# Patient Record
Sex: Male | Born: 1935 | Race: White | Hispanic: No | Marital: Married | Smoking: Former smoker
Health system: Southern US, Community
[De-identification: ages and names within clinical notes are randomized; demographics above are authoritative.]

## PROBLEM LIST (undated history)

## (undated) DIAGNOSIS — C61 Malignant neoplasm of prostate: Secondary | ICD-10-CM

## (undated) DIAGNOSIS — C649 Malignant neoplasm of unspecified kidney, except renal pelvis: Principal | ICD-10-CM

## (undated) DIAGNOSIS — N189 Chronic kidney disease, unspecified: Secondary | ICD-10-CM

## (undated) DIAGNOSIS — Z8719 Personal history of other diseases of the digestive system: Secondary | ICD-10-CM

## (undated) DIAGNOSIS — M1990 Unspecified osteoarthritis, unspecified site: Secondary | ICD-10-CM

## (undated) DIAGNOSIS — E785 Hyperlipidemia, unspecified: Secondary | ICD-10-CM

## (undated) DIAGNOSIS — C4491 Basal cell carcinoma of skin, unspecified: Secondary | ICD-10-CM

## (undated) DIAGNOSIS — M48061 Spinal stenosis, lumbar region without neurogenic claudication: Secondary | ICD-10-CM

## (undated) DIAGNOSIS — I10 Essential (primary) hypertension: Secondary | ICD-10-CM

## (undated) DIAGNOSIS — G4733 Obstructive sleep apnea (adult) (pediatric): Secondary | ICD-10-CM

## (undated) DIAGNOSIS — J129 Viral pneumonia, unspecified: Secondary | ICD-10-CM

## (undated) HISTORY — PX: BASAL CELL CARCINOMA EXCISION: SHX1214

## (undated) HISTORY — PX: INGUINAL HERNIA REPAIR: SHX194

## (undated) HISTORY — PX: LUMBAR LAMINECTOMY: SHX95

## (undated) HISTORY — DX: Malignant neoplasm of unspecified kidney, except renal pelvis: C64.9

## (undated) HISTORY — PX: INGUINAL HERNIA REPAIR: SUR1180

## (undated) HISTORY — PX: FRACTURE SURGERY: SHX138

## (undated) HISTORY — DX: Hyperlipidemia, unspecified: E78.5

---
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
## 1969-06-16 HISTORY — PX: VASECTOMY: SHX75

## 1999-04-15 ENCOUNTER — Ambulatory Visit: Admission: RE | Admit: 1999-04-15 | Discharge: 1999-04-15 | Payer: Self-pay | Admitting: Family Medicine

## 2002-12-31 ENCOUNTER — Encounter (INDEPENDENT_AMBULATORY_CARE_PROVIDER_SITE_OTHER): Payer: Self-pay | Admitting: Specialist

## 2002-12-31 ENCOUNTER — Ambulatory Visit (HOSPITAL_COMMUNITY): Admission: RE | Admit: 2002-12-31 | Discharge: 2002-12-31 | Payer: Self-pay | Admitting: Gastroenterology

## 2003-10-17 HISTORY — PX: FIBULA FRACTURE SURGERY: SHX947

## 2006-12-18 ENCOUNTER — Ambulatory Visit: Payer: Self-pay | Admitting: Family Medicine

## 2006-12-18 DIAGNOSIS — R5383 Other fatigue: Secondary | ICD-10-CM

## 2006-12-18 DIAGNOSIS — I10 Essential (primary) hypertension: Secondary | ICD-10-CM | POA: Insufficient documentation

## 2006-12-18 DIAGNOSIS — R5381 Other malaise: Secondary | ICD-10-CM

## 2006-12-22 ENCOUNTER — Encounter: Payer: Self-pay | Admitting: Family Medicine

## 2006-12-24 ENCOUNTER — Encounter: Payer: Self-pay | Admitting: Family Medicine

## 2006-12-24 LAB — CONVERTED CEMR LAB
ALT: 16 units/L (ref 0–53)
AST: 14 units/L (ref 0–37)
Alkaline Phosphatase: 49 units/L (ref 39–117)
BUN: 17 mg/dL (ref 6–23)
Calcium: 9.9 mg/dL (ref 8.4–10.5)
Creatinine, Ser: 0.86 mg/dL (ref 0.40–1.50)
HCT: 48.7 % (ref 39.0–52.0)
HDL: 51 mg/dL (ref >39)
Hemoglobin: 15.7 g/dL (ref 13.0–17.0)
MCHC: 32.2 g/dL (ref 30.0–36.0)
MCV: 93.5 fL (ref 78.0–100.0)
Platelets: 270 10*3/uL (ref 150–400)
RDW: 13.3 % (ref 11.5–14.0)
TSH: 2.556 microintl units/mL (ref 0.350–5.50)
Total Bilirubin: 0.4 mg/dL (ref 0.3–1.2)
Total CHOL/HDL Ratio: 4.1
VLDL: 16 mg/dL (ref 0–40)

## 2007-01-09 ENCOUNTER — Ambulatory Visit: Payer: Self-pay | Admitting: Family Medicine

## 2007-01-09 DIAGNOSIS — M25569 Pain in unspecified knee: Secondary | ICD-10-CM | POA: Insufficient documentation

## 2007-01-09 DIAGNOSIS — E7800 Pure hypercholesterolemia, unspecified: Secondary | ICD-10-CM | POA: Insufficient documentation

## 2007-04-30 ENCOUNTER — Ambulatory Visit: Payer: Self-pay | Admitting: Family Medicine

## 2007-04-30 DIAGNOSIS — H9319 Tinnitus, unspecified ear: Secondary | ICD-10-CM | POA: Insufficient documentation

## 2007-05-03 ENCOUNTER — Encounter: Payer: Self-pay | Admitting: Family Medicine

## 2007-05-03 LAB — CONVERTED CEMR LAB: Direct LDL: 101 mg/dL — ABNORMAL HIGH

## 2007-10-17 DIAGNOSIS — C61 Malignant neoplasm of prostate: Secondary | ICD-10-CM

## 2007-10-17 HISTORY — DX: Malignant neoplasm of prostate: C61

## 2007-12-31 ENCOUNTER — Ambulatory Visit: Payer: Self-pay | Admitting: Family Medicine

## 2007-12-31 DIAGNOSIS — R351 Nocturia: Secondary | ICD-10-CM | POA: Insufficient documentation

## 2007-12-31 DIAGNOSIS — F528 Other sexual dysfunction not due to a substance or known physiological condition: Secondary | ICD-10-CM | POA: Insufficient documentation

## 2008-01-01 ENCOUNTER — Ambulatory Visit: Payer: Self-pay | Admitting: Family Medicine

## 2008-01-01 DIAGNOSIS — C61 Malignant neoplasm of prostate: Secondary | ICD-10-CM

## 2008-01-01 LAB — CONVERTED CEMR LAB
ALT: 22 units/L (ref 0–53)
AST: 16 units/L (ref 0–37)
BUN: 11 mg/dL (ref 6–23)
Bilirubin Urine: NEGATIVE
Blood in Urine, dipstick: NEGATIVE
Calcium: 10.2 mg/dL (ref 8.4–10.5)
Chloride: 100 meq/L (ref 96–112)
Cholesterol: 167 mg/dL (ref 0–200)
Creatinine, Ser: 0.81 mg/dL (ref 0.40–1.50)
Glucose, Urine, Semiquant: NEGATIVE
HDL: 52 mg/dL (ref >39)
Specific Gravity, Urine: 1.015
TSH: 2.93 microintl units/mL (ref 0.350–5.50)
Total Bilirubin: 0.7 mg/dL (ref 0.3–1.2)
Total CHOL/HDL Ratio: 3.2
VLDL: 29 mg/dL (ref 0–40)
pH: 7.5

## 2008-01-13 ENCOUNTER — Encounter: Payer: Self-pay | Admitting: Family Medicine

## 2008-01-30 ENCOUNTER — Encounter: Payer: Self-pay | Admitting: Family Medicine

## 2008-02-05 ENCOUNTER — Encounter: Payer: Self-pay | Admitting: Family Medicine

## 2008-03-16 ENCOUNTER — Encounter: Payer: Self-pay | Admitting: Family Medicine

## 2008-03-17 ENCOUNTER — Ambulatory Visit: Admission: RE | Admit: 2008-03-17 | Discharge: 2008-06-15 | Payer: Self-pay | Admitting: Radiation Oncology

## 2008-03-23 ENCOUNTER — Encounter: Payer: Self-pay | Admitting: Family Medicine

## 2008-06-03 ENCOUNTER — Encounter: Payer: Self-pay | Admitting: Family Medicine

## 2008-07-13 ENCOUNTER — Ambulatory Visit: Admission: RE | Admit: 2008-07-13 | Discharge: 2008-10-05 | Payer: Self-pay | Admitting: Radiation Oncology

## 2008-08-18 ENCOUNTER — Ambulatory Visit: Payer: Self-pay | Admitting: Family Medicine

## 2008-08-18 DIAGNOSIS — L988 Other specified disorders of the skin and subcutaneous tissue: Secondary | ICD-10-CM

## 2008-08-19 LAB — CONVERTED CEMR LAB
BUN: 12 mg/dL (ref 6–23)
Chloride: 102 meq/L (ref 96–112)
Creatinine, Ser: 0.79 mg/dL (ref 0.40–1.50)
Glucose, Bld: 88 mg/dL (ref 70–99)
Potassium: 4.5 meq/L (ref 3.5–5.3)

## 2008-09-28 ENCOUNTER — Encounter: Payer: Self-pay | Admitting: Family Medicine

## 2008-10-29 ENCOUNTER — Encounter: Payer: Self-pay | Admitting: Family Medicine

## 2009-04-28 ENCOUNTER — Encounter: Payer: Self-pay | Admitting: Family Medicine

## 2009-07-05 ENCOUNTER — Ambulatory Visit: Payer: Self-pay | Admitting: Family Medicine

## 2009-07-05 ENCOUNTER — Encounter: Admission: RE | Admit: 2009-07-05 | Discharge: 2009-07-05 | Payer: Self-pay | Admitting: General Surgery

## 2009-07-06 LAB — CONVERTED CEMR LAB
Albumin: 4.7 g/dL (ref 3.5–5.2)
Alkaline Phosphatase: 44 units/L (ref 39–117)
BUN: 10 mg/dL (ref 6–23)
Creatinine, Ser: 0.69 mg/dL (ref 0.40–1.50)
Glucose, Bld: 94 mg/dL (ref 70–99)
HDL: 62 mg/dL (ref >39)
LDL Cholesterol: 85 mg/dL (ref 0–99)
Total Bilirubin: 0.5 mg/dL (ref 0.3–1.2)
Total CHOL/HDL Ratio: 2.7
Triglycerides: 100 mg/dL (ref <150)
VLDL: 20 mg/dL (ref 0–40)

## 2009-07-08 ENCOUNTER — Encounter: Payer: Self-pay | Admitting: Family Medicine

## 2009-07-09 ENCOUNTER — Ambulatory Visit (HOSPITAL_BASED_OUTPATIENT_CLINIC_OR_DEPARTMENT_OTHER): Admission: RE | Admit: 2009-07-09 | Discharge: 2009-07-09 | Payer: Self-pay | Admitting: General Surgery

## 2009-07-27 ENCOUNTER — Encounter: Payer: Self-pay | Admitting: Family Medicine

## 2010-02-18 ENCOUNTER — Encounter: Payer: Self-pay | Admitting: Family Medicine

## 2010-04-19 ENCOUNTER — Encounter: Payer: Self-pay | Admitting: Family Medicine

## 2010-05-16 ENCOUNTER — Telehealth: Payer: Self-pay | Admitting: Family Medicine

## 2010-05-18 ENCOUNTER — Encounter (INDEPENDENT_AMBULATORY_CARE_PROVIDER_SITE_OTHER): Payer: Self-pay | Admitting: *Deleted

## 2010-06-09 ENCOUNTER — Ambulatory Visit: Payer: Self-pay | Admitting: Family Medicine

## 2010-06-09 DIAGNOSIS — M542 Cervicalgia: Secondary | ICD-10-CM

## 2010-07-05 ENCOUNTER — Encounter: Payer: Self-pay | Admitting: Family Medicine

## 2010-08-08 ENCOUNTER — Encounter: Payer: Self-pay | Admitting: Family Medicine

## 2010-08-09 LAB — CONVERTED CEMR LAB
ALT: 17 units/L (ref 0–53)
Alkaline Phosphatase: 35 units/L — ABNORMAL LOW (ref 39–117)
CO2: 31 meq/L (ref 19–32)
Cholesterol: 164 mg/dL (ref 0–200)
Creatinine, Ser: 0.75 mg/dL (ref 0.40–1.50)
Total Bilirubin: 0.6 mg/dL (ref 0.3–1.2)
Total CHOL/HDL Ratio: 2.6
VLDL: 17 mg/dL (ref 0–40)

## 2010-11-07 ENCOUNTER — Ambulatory Visit
Admission: RE | Admit: 2010-11-07 | Discharge: 2010-11-07 | Payer: Self-pay | Source: Home / Self Care | Attending: Family Medicine | Admitting: Family Medicine

## 2010-11-07 DIAGNOSIS — M79609 Pain in unspecified limb: Secondary | ICD-10-CM | POA: Insufficient documentation

## 2010-11-15 NOTE — Letter (Signed)
Summary: Letter Regarding Osteoporosis Screening/[employer]  Letter Regarding Osteoporosis Screening/[employer]   Imported By: Lanelle Bal 05/16/2010 12:35:52  _____________________________________________________________________  External Attachment:    Type:   Image     Comment:   External Document

## 2010-11-15 NOTE — Letter (Signed)
Summary: Generic Letter  [facility]  [address]   [place], [place] [address]   Phone: [phone]  Fax: [phone]    05/18/2010  Anthony Stark [address] [place], [place]  [address]  Dear Anthony Stark,    We have been unable to reach you after multiple attempt. Dr. Linford Arnold would like for you to know since you are a former smoker you are due for a screen for abdominal aortic aneurysm. This an ultrasound and we can schedule if you are ok with this.Please contact out office at [phone].       Sincerely,    Nani Gasser, MD

## 2010-11-15 NOTE — Assessment & Plan Note (Signed)
Summary: HTN, neck pain   Vital Signs:  Patient profile:   76 year old male Height:      69.5 inches Weight:      191 pounds BMI:     27.90 Pulse rate:   84 / minute BP sitting:   120 / 74  (left arm) Cuff size:   large  Vitals Entered By: Kathlene November (June 09, 2010 9:48 AM) CC: followup BP, Hypertension Management   Primary Care Provider:  Linford Arnold, C  CC:  followup BP and Hypertension Management.  History of Present Illness: New URologist at at [facility] is Dr. Creed Copper for his f/u prostate Cancer.  Did complete radiation.  Due for another PSA test in September.    Still occ gets ankle pain in his right ankle wth walking. has changed his walking technique and feels it is better.   Has some new cracking and popping in his cervical spine.  Gets pain at full rotation of the neck to the side. Notices it with driving.  Does feel it is a little better. Has been dong neck stretches and feelsit may b helping.    Occ has pain prink sensation in the left forarms that usually only lasts for a few seconday. Started a few months ago. No triggers. Seems to be getting better. No weakness or pain.   Would like another shingle vac  rx.    Hypertension History:      He denies headache, chest pain, palpitations, dyspnea with exertion, orthopnea, PND, peripheral edema, visual symptoms, neurologic problems, syncope, and side effects from treatment.  He notes no problems with any antihypertensive medication side effects.        Positive major cardiovascular risk factors include male age 47 years old or older, hyperlipidemia, and hypertension.  Negative major cardiovascular risk factors include no history of diabetes, negative family history for ischemic heart disease, and non-tobacco-user status.        Further assessment for target organ damage reveals no history of ASHD, cardiac end-organ damage (CHF/LVH), stroke/TIA, peripheral vascular disease, renal insufficiency, or hypertensive retinopathy.       Current Medications (verified): 1)  Quinapril-Hydrochlorothiazide 10-12.5 Mg Tabs (Quinapril-Hydrochlorothiazide) .... Take 1 Tablet By Mouth Once A Day 2)  Zocor 20 Mg Tabs (Simvastatin) .... Take 1 Tablet By Mouth Once A Day At Bedtime  Allergies (verified): No Known Drug Allergies  Comments:  Nurse/Medical Assistant: The patient's medications and allergies were reviewed with the patient and were updated in the Medication and Allergy Lists. Kathlene November (June 09, 2010 9:49 AM) Kathlene November (June 09, 2010 9:51 AM)  Physical Exam  General:  Well-developed,well-nourished,in no acute distress; alert,appropriate and cooperative throughout examination Lungs:  Normal respiratory effort, chest expands symmetrically. Lungs are clear to auscultation, no crackles or wheezes. Heart:  Normal rate and regular rhythm. S1 and S2 normal without gallop, murmur, click, rub or other extra sounds. Msk:  Neck with normal flexion, Dec extension and dec rotation right and left. Dec side bending.  Crepitis with motion.  Nontender cervical spine and paraspinous muscles.  Left arm and hand strength 5/5.  Neurologic:  Left forearm sensation in take.  No skin lesions.  Skin:  no rashes.   Psych:  Cognition and judgment appear intact. Alert and cooperative with normal attention span and concentration. No apparent delusions, illusions, hallucinations   Impression & Recommendations:  Problem # 1:  HYPERTENSION, BENIGN (ICD-401.1)  Looks great today. Continue current regimen.  Due for labs in September.  His updated medication list for this problem includes:    Quinapril-hydrochlorothiazide 10-12.5 Mg Tabs (Quinapril-hydrochlorothiazide) .Marland Kitchen... Take 1 tablet by mouth once a day  BP today: 120/74 Prior BP: 112/63 (07/05/2009)  Prior 10 Yr Risk Heart Disease: 9 % (07/05/2009)  Labs Reviewed: K+: 4.8 (07/05/2009) Creat: : 0.69 (07/05/2009)   Chol: 167 (07/05/2009)   HDL: 62 (07/05/2009)   LDL: 85  (07/05/2009)   TG: 100 (07/05/2009)  Orders: T-Comprehensive Metabolic Panel ([id number])  Problem # 2:  NECK PAIN (ICD-723.1) Given h.O to work on exercises.  He seems to be improving.  Likel has OA or maye DDD but explained tha the exercises should help. Can use Tylenol as needed for pain.  If gets worse then let me konw.  Teh arm tingling could also be from his neck but it seems to be improving so will keep an eye on it.   Complete Medication List: 1)  Quinapril-hydrochlorothiazide 10-12.5 Mg Tabs (Quinapril-hydrochlorothiazide) .... Take 1 tablet by mouth once a day 2)  Zocor 20 Mg Tabs (Simvastatin) .... Take 1 tablet by mouth once a day at bedtime 3)  Zostavax  .... Inject im x 1  Other Orders: T-Lipid Profile ([id number]) Pneumococcal Vaccine (57846) Admin 1st Vaccine (96295)  Hypertension Assessment/Plan:      The patient's hypertensive risk group is category B: At least one risk factor (excluding diabetes) with no target organ damage.  His calculated 10 year risk of coronary heart disease is 7 %.  Today's blood pressure is 120/74.  His blood pressure goal is < 140/90.  Patient Instructions: 1)  Can do the exercises given on your handout sheet.  if you are getting worse then follow back up with med and we will consider getting an xray.  2)  Go in September for your labs. We will call your lab results.  Prescriptions: ZOSTAVAX Inject IM x 1  #1 x 0   Entered and Authorized by:   Nani Gasser MD   Signed by:   Nani Gasser MD on 06/09/2010   Method used:   Print then Give to Patient   RxID:   [id number]       Immunizations Administered:  Pneumonia Vaccine:    Vaccine Type: Pneumovax (Medicare)    Site: left deltoid    Mfr: Merck    Dose: 0.5 ml    Route: IM    Given by: Sue Lush McCrimmon CMA, (AAMA)    Exp. Date: 12/21/2011    Lot #: [id number]    VIS given: 05/13/96 version given June 09, 2010.  Appended Document: HTN, neck pain    Clinical  Lists Changes  Observations: Added new observation of ZOSTAVAX: Zostavax (07/22/2010 10:03)       Immunization History:  Zostavax History:    Zostavax # 1:  zostavax (07/22/2010)

## 2010-11-15 NOTE — Progress Notes (Signed)
Summary: Screen AAA  Phone Note Outgoing Call   Summary of Call: Call pt: Since a smoker (former) he is due for a screen for abdominal aortic aneurysm (AAA). This is an [place]. We can schedule if he is OK with it. August  1, 20118:08 AM Wallis Spizzirri MD, Santina Evans   Follow-up for Phone Call        LMOVM to call backl to schedule ultrasound if pt want to. Follow-up by: Avon Gully CMA, Duncan Dull),  May 16, 2010 9:33 AM  Additional Follow-up for Phone Call Additional follow up Details #1::        LM to call back Additional Follow-up by: Avon Gully CMA, Duncan Dull),  May 17, 2010 1:20 PM    Additional Follow-up for Phone Call Additional follow up Details #2::    called and left message for pt to call back re ultrasound if he wants [place] to schedule it Follow-up by: Avon Gully CMA, Duncan Dull),  May 18, 2010 9:09 AM  Additional Follow-up for Phone Call Additional follow up Details #3:: Details for Additional Follow-up Action Taken: never heard back from pt. mailed letter of above instructions. will await phone call. Additional Follow-up by: Avon Gully CMA, Duncan Dull),  May 18, 2010 11:21 AM

## 2010-11-15 NOTE — Letter (Signed)
Summary: Care Consideration Regarding Smokers & Screening for Aneurysm/[employer]  Care Consideration Regarding Smokers & Screening for Aneurysm/[employer]   Imported By: Lanelle Bal 04/14/2010 09:09:01  _____________________________________________________________________  External Attachment:    Type:   Image     Comment:   External Document

## 2010-11-15 NOTE — Letter (Signed)
Summary: [facility]  [id number]   Imported By: Lanelle Bal 07/15/2010 10:56:26  _____________________________________________________________________  External Attachment:    Type:   Image     Comment:   External Document

## 2010-11-17 NOTE — Assessment & Plan Note (Signed)
Summary: Rt. foot hurts on top   Vital Signs:  Patient profile:   76 year old male Height:      69.5 inches Weight:      193 pounds Pulse rate:   75 / minute BP sitting:   144 / 86  (right arm) Cuff size:   large  Vitals Entered By: Avon Gully CMA, Duncan Dull) (November 07, 2010 1:14 PM) CC: rt foot pain x 1 month,hurts to walk    Primary Care Provider:  Linford Arnold, C  CC:  rt foot pain x 1 month and hurts to walk .  History of Present Illness: rt foot pain x 1 month,hurts to walk. Not painful at night.  Only bothers him when he takes his walks. Feels it is his shoes rubbing so has loosened her shoestrings and even that hasn't helped.  Pain pain in teh big toe joint and has had pain around his ankle for years.  This pain is new, started about  month ago. He has had to dec his walking becasue of the pain. Did notice it felt warm and hot over the weekend, but only one day. Never looked red. no prior trauma or fracture.  No history of gout or other rheumatoid diseases.no alleviating symptoms except for rest.  Current Medications (verified): 1)  Quinapril-Hydrochlorothiazide 10-12.5 Mg Tabs (Quinapril-Hydrochlorothiazide) .... Take 1 Tablet By Mouth Once A Day 2)  Zocor 20 Mg Tabs (Simvastatin) .... Take 1 Tablet By Mouth Once A Day At Bedtime 3)  Zostavax .... Inject Im X 1  Allergies (verified): No Known Drug Allergies  Comments:  Nurse/Medical Assistant: The patient's medications and allergies were reviewed with the patient and were updated in the Medication and Allergy Lists. Avon Gully CMA, Duncan Dull) (November 07, 2010 1:15 PM)  Social History: Reviewed history from 12/18/2006 and no changes required. Quit smoking. Retired PhD.  Married to [place] with 2 adult children.   Former Smoker Alcohol use-yes Drug use-no Regular exercise-no  Physical Exam  General:  Well-developed,well-nourished,in no acute distress; alert,appropriate and cooperative throughout examination Msk:   right foot with normal range of motion of the ankle and toes.  He is nontender over the top of the foot.  He is nontender around the medial and lateral malleolus.  There is no erythema or redness or swelling apparent.  His dorsal pedal and posterior tibial pulses are 2+ bilaterally.  There is hair on top of the foot.  I am unable to re-create his pain by moving the metatarsals back and forth.   Impression & Recommendations:  Problem # 1:  FOOT PAIN, RIGHT (ICD-729.5) Assessment New unclear etiology though I suspect some type of neuroma or cyst.  I was unable to re-create his pain.  Because it does not bother him except with walking for prolonged distances and times I really am not convinced this is just a shoe wear.  I would like to set him up with a podiatrist for further evaluation of his foot.  at this point in time I do not suspect a fracture thus I did not get an x-ray today.  Likely the podiatrist will get one anyway. In the meantime he can certainly use Aleve or ibuprofen p.r.n. for pain.  He can certainly pretreat before exercise.  He has also cut back his walking to 30 minutes twice a day instead of one hour at one time.  This seems to have helped as well.  A really do not want him to necessarily decrease his exercise at  this time but decreasing the amount of time during each interval should help Orders: Podiatry Referral (Podiatry)  Complete Medication List: 1)  Quinapril-hydrochlorothiazide 10-12.5 Mg Tabs (Quinapril-hydrochlorothiazide) .... Take 1 tablet by mouth once a day 2)  Zocor 20 Mg Tabs (Simvastatin) .... Take 1 tablet by mouth once a day at bedtime  Patient Instructions: 1)  Can use Aleve or Ibuprofen about 30 minutes before his walk.   2)  We will call you with the Podiatry referral.     Orders Added: 1)  Podiatry Referral [Podiatry] 2)  Est. Patient Level IV [[address]]

## 2011-01-20 LAB — COMPREHENSIVE METABOLIC PANEL
ALT: 13 U/L (ref 0–53)
AST: 16 U/L (ref 0–37)
CO2: 28 mEq/L (ref 19–32)
Calcium: 9.5 mg/dL (ref 8.4–10.5)
GFR calc Af Amer: >60 mL/min (ref >60)
Sodium: 141 mEq/L (ref 135–145)
Total Protein: 6.7 g/dL (ref 6.0–8.3)

## 2011-01-20 LAB — DIFFERENTIAL
Eosinophils Absolute: 0.1 10*3/uL (ref 0.0–0.7)
Eosinophils Relative: 3 % (ref 0–5)
Lymphs Abs: 1.4 10*3/uL (ref 0.7–4.0)
Monocytes Absolute: 0.6 10*3/uL (ref 0.1–1.0)
Monocytes Relative: 10 % (ref 3–12)

## 2011-01-20 LAB — CBC
MCHC: 34.5 g/dL (ref 30.0–36.0)
RBC: 4.2 MIL/uL — ABNORMAL LOW (ref 4.22–5.81)
RDW: 13.5 % (ref 11.5–15.5)

## 2011-02-07 ENCOUNTER — Telehealth: Payer: Self-pay | Admitting: *Deleted

## 2011-02-07 DIAGNOSIS — Z125 Encounter for screening for malignant neoplasm of prostate: Secondary | ICD-10-CM

## 2011-02-07 DIAGNOSIS — I10 Essential (primary) hypertension: Secondary | ICD-10-CM

## 2011-02-07 NOTE — Telephone Encounter (Signed)
Pt LMOM stating he is due for PSA and would like to know if he is due for any additional lab work so he can have all labs drawn at the same time. Please advise

## 2011-02-08 NOTE — Telephone Encounter (Signed)
Labs at present and we'll fax him to the lab today, patient can go anytime.

## 2011-02-08 NOTE — Telephone Encounter (Signed)
Left message on vm

## 2011-03-03 NOTE — Op Note (Signed)
   NAME:  KYRE, JEFFRIES                         ACCOUNT NO.:  [id number]   MEDICAL RECORD NO.:  [id number]                   PATIENT TYPE:  AMB   LOCATION:  ENDO                                 FACILITY:  [facility]   PHYSICIAN:  Danise Edge, M.D.                DATE OF BIRTH:  04-05-1935   DATE OF PROCEDURE:  12/31/2002  DATE OF DISCHARGE:                                 OPERATIVE REPORT   PROCEDURE:  Colonoscopy with polypectomy.   ENDOSCOPIST:  Danise Edge, M.D.   REFERRING PHYSICIAN:  Dellis Anes. Idell Pickles, M.D.   INDICATIONS:  The patient is a 76 year old male born Feb 22, 1935.  On  December 03, 2002, he was treated for an external hemorrhoid associated with  rectal bleeding.  He is scheduled for a complete colonoscopy with  polypectomy to prevent colon cancer.   PREMEDICATION:  Versed 7.5 mg, Demerol 50 mg.   ENDOSCOPE:  Olympus adult colonoscope.   DESCRIPTION OF PROCEDURE:  After obtaining informed consent, the patient was  placed in the left lateral decubitus position.  I administered intravenous  Demerol and intravenous to achieve conscious sedation for the procedure.  The patient's blood pressure, oxygen saturation, and cardiac rhythm were  monitored throughout the procedure and documented in the medical record.   Anal inspection was normal.  Digital rectal exam revealed a non-nodular  prostate.  The Olympus adult deo colonoscope was introduced into the rectum  and advanced to the cecum.  A normal-appearing ileocecal valve was  intubated, and the distal ileum inspected.  Colonic preparation for the exam  today was excellent.   Rectum normal.   Sigmoid colon and descending colon:  Normal.   Splenic flexure normal.   Transverse colon normal.   Hepatic flexure normal.   Ascending colon, cecum and ileocecal valve:  From the mid cecum, a 0.5 mm  sessile polyp was removed with cold biopsy forceps.  From the proximal  ascending colon, a 1 mm sessile polyp was  removed with the electrocautery  snare.   Distal ileum:  Normal.    ASSESSMENT:  Two diminutive polyps were removed from the cecum and proximal  ascending colon.  Otherwise normal proctocolonoscopy to the cecum.   RECOMMENDATIONS:  Repeat colonoscopy in five years if polyps return  neoplastic pathologically.                                               Danise Edge, M.D.    MJ/MEDQ  D:  12/31/2002  T:  01/01/2003  Job:  [id number]   cc:   Dellis Anes. Idell Pickles, M.D.  [address]  [place]  [place] [address]  Fax: [phone]

## 2011-05-24 ENCOUNTER — Encounter: Payer: Self-pay | Admitting: Family Medicine

## 2011-05-25 ENCOUNTER — Ambulatory Visit (INDEPENDENT_AMBULATORY_CARE_PROVIDER_SITE_OTHER): Payer: Medicare PPO | Admitting: Family Medicine

## 2011-05-25 ENCOUNTER — Encounter: Payer: Self-pay | Admitting: Family Medicine

## 2011-05-25 DIAGNOSIS — M25579 Pain in unspecified ankle and joints of unspecified foot: Secondary | ICD-10-CM

## 2011-05-25 DIAGNOSIS — C61 Malignant neoplasm of prostate: Secondary | ICD-10-CM

## 2011-05-25 DIAGNOSIS — M25559 Pain in unspecified hip: Secondary | ICD-10-CM

## 2011-05-25 NOTE — Progress Notes (Signed)
  Subjective:    Patient ID: Anthony Stark, male    DOB: 12/25/1934, 76 y.o.   MRN: [id number] . HPI Pain right over the left hip (outer bone) for about 1 mo. Then will also have pain on his left lateral ankle where he has a plate in his ankle. Will also get a cramp in his outer calf.  Puts ice on it and it helps. Pain is coming and going. NO radiation of pain. Walking doesn't hurt his ankle or hip.  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  Today he actually feels better.  Hip is worse when tries to turn over in bed or get out of bed.  He can sleep on that hip.  No pain relievers.  He did decrease his walking.     Review of Systems     Objective:   Physical Exam  Neurological:       Hips, knees, ankles with NROM & strength 5/5.  Nontender over the greater trocahnter on the left.  Nontender calf muscle Normal squeeze test. He has a well healed scar over his ankle from his hardware. No swelling the knee or ankle. Patellar reflex 2+ on the LT.           Assessment & Plan:  Gave him reassurance that his exam is normal and he has been progressively been getting better. LIkely MSK strain.  Can resume his walking since this doesn't aggrevate his sxs.  I ankle pain continues can get xray to make sure his hardware intact.  If changes or get worse then le me know.  If calf pain or camping continues this rec check a potassium level.  I don't think this is sciatica.

## 2011-05-29 ENCOUNTER — Telehealth: Payer: Self-pay | Admitting: *Deleted

## 2011-05-29 NOTE — Telephone Encounter (Signed)
Message copied by Wyline Beady on Mon May 29, 2011 11:05 AM ------      Message from: Nani Gasser D      Created: Thu May 25, 2011  8:47 PM       Will you call pt and find out where had last colonoscpoy and see if they can fax report. I don't see in his records that I have a copy.

## 2011-05-29 NOTE — Telephone Encounter (Signed)
Pt cant remember where he went

## 2011-07-10 ENCOUNTER — Telehealth: Payer: Self-pay | Admitting: Family Medicine

## 2011-07-10 NOTE — Telephone Encounter (Signed)
Pt called and said he had been seeing a urologist at [facility] for prostate CA and gets a PSA checked every 6 mths.  He was wondering if he can get a PSA checked at our lab so he don't have to drive all the way to [facility] since lives in [place].   Plan:  Told him we can go through Dr. Linford Arnold to get a order or either his urologist will need to fax a order to the [facility] lab here in our building.  Pt said he had called his urologist several days ago and they still had not sent an order to lab.  Pt informed to call them back and have them do the order today. Jarvis Newcomer, LPN Domingo Dimes

## 2011-07-21 ENCOUNTER — Other Ambulatory Visit: Payer: Self-pay | Admitting: *Deleted

## 2011-07-21 MED ORDER — QUINAPRIL-HYDROCHLOROTHIAZIDE 10-12.5 MG PO TABS: 1.0000 | ORAL_TABLET | Freq: Every day | ORAL | Status: DC

## 2011-07-21 MED ORDER — SIMVASTATIN 20 MG PO TABS: 20.0000 mg | ORAL_TABLET | Freq: Every day | ORAL | Status: DC

## 2012-01-04 ENCOUNTER — Encounter: Payer: Self-pay | Admitting: *Deleted

## 2012-01-08 ENCOUNTER — Ambulatory Visit (INDEPENDENT_AMBULATORY_CARE_PROVIDER_SITE_OTHER): Payer: Medicare PPO | Admitting: Family Medicine

## 2012-01-08 ENCOUNTER — Encounter: Payer: Self-pay | Admitting: Family Medicine

## 2012-01-08 VITALS — BP 137/81 | HR 99 | Ht 69.5 in | Wt 193.0 lb

## 2012-01-08 DIAGNOSIS — Z1331 Encounter for screening for depression: Secondary | ICD-10-CM

## 2012-01-08 DIAGNOSIS — Z9181 History of falling: Secondary | ICD-10-CM

## 2012-01-08 DIAGNOSIS — E785 Hyperlipidemia, unspecified: Secondary | ICD-10-CM

## 2012-01-08 DIAGNOSIS — Z23 Encounter for immunization: Secondary | ICD-10-CM

## 2012-01-08 DIAGNOSIS — I10 Essential (primary) hypertension: Secondary | ICD-10-CM

## 2012-01-08 NOTE — Patient Instructions (Addendum)
1.5 Gram Low Sodium Diet A 1.5 gram sodium diet restricts the amount of sodium in the diet to no more than 1.5 g or 1500 mg daily. The American Heart Association recommends Americans over the age of 20 to consume no more than 1500 mg of sodium each day to reduce the risk of developing high blood pressure. Research also shows that limiting sodium may reduce heart attack and stroke risk. Many foods contain sodium for flavor and sometimes as a preservative. When the amount of sodium in a diet needs to be low, it is important to know what to look for when choosing foods and drinks. The following includes some information and guidelines to help make it easier for you to adapt to a low sodium diet. QUICK TIPS  Do not add salt to food.   Avoid convenience items and fast food.   Choose unsalted snack foods.   Buy lower sodium products, often labeled as "lower sodium" or "no salt added."   Check food labels to learn how much sodium is in 1 serving.   When eating at a restaurant, ask that your food be prepared with less salt or none, if possible.  READING FOOD LABELS FOR SODIUM INFORMATION The nutrition facts label is a good place to find how much sodium is in foods. Look for products with no more than 400 mg of sodium per serving. Remember that 1.5 g = 1500 mg. The food label may also list foods as:  Sodium-free: Less than 5 mg in a serving.   Very low sodium: 35 mg or less in a serving.   Low-sodium: 140 mg or less in a serving.   Light in sodium: 50% less sodium in a serving. For example, if a food that usually has 300 mg of sodium is changed to become light in sodium, it will have 150 mg of sodium.   Reduced sodium: 25% less sodium in a serving. For example, if a food that usually has 400 mg of sodium is changed to reduced sodium, it will have 300 mg of sodium.  CHOOSING FOODS Grains  Avoid: Salted crackers and snack items. Some cereals, including instant hot cereals. Bread stuffing and  biscuit mixes. Seasoned rice or pasta mixes.   Choose: Unsalted snack items. Low-sodium cereals, oats, puffed wheat and rice, shredded wheat. English muffins and bread. Pasta.  Meats  Avoid: Salted, canned, smoked, spiced, pickled meats, including fish and poultry. Bacon, ham, sausage, cold cuts, hot dogs, anchovies.   Choose: Low-sodium canned tuna and salmon. Fresh or frozen meat, poultry, and fish.  Dairy  Avoid: Processed cheese and spreads. Cottage cheese. Buttermilk and condensed milk. Regular cheese.   Choose: Milk. Low-sodium cottage cheese. Yogurt. Sour cream. Low-sodium cheese.  Fruits and Vegetables  Avoid: Regular canned vegetables. Regular canned tomato sauce and paste. Frozen vegetables in sauces. Olives. Pickles. Relishes. Sauerkraut.   Choose: Low-sodium canned vegetables. Low-sodium tomato sauce and paste. Frozen or fresh vegetables. Fresh and frozen fruit.  Condiments  Avoid: Canned and packaged gravies. Worcestershire sauce. Tartar sauce. Barbecue sauce. Soy sauce. Steak sauce. Ketchup. Onion, garlic, and table salt. Meat flavorings and tenderizers.   Choose: Fresh and dried herbs and spices. Low-sodium varieties of mustard and ketchup. Lemon juice. Tabasco sauce. Horseradish.  SAMPLE 1.5 GRAM SODIUM MEAL PLAN Breakfast / Sodium (mg)  1 cup low-fat milk / 143 mg   1 whole-wheat English muffin / 240 mg   1 tbs heart-healthy margarine / 153 mg   1 hard-boiled egg /   139 mg   1 small orange / 0 mg  Lunch / Sodium (mg)  1 cup raw carrots / 76 mg   2 tbs no salt added peanut butter / 5 mg   2 slices whole-wheat bread / 270 mg   1 tbs jelly / 6 mg    cup red grapes / 2 mg  Dinner / Sodium (mg)  1 cup whole-wheat pasta / 2 mg   1 cup low-sodium tomato sauce / 73 mg   3 oz lean ground beef / 57 mg   1 small side salad (1 cup raw spinach leaves,  cup cucumber,  cup yellow bell pepper) with 1 tsp olive oil and 1 tsp red wine vinegar / 25 mg  Snack /  Sodium (mg)  1 container low-fat vanilla yogurt / 107 mg   3 graham cracker squares / 127 mg  Nutrient Analysis  Calories: 1745   Protein: 75 g   Carbohydrate: 237 g   Fat: 57 g   Sodium: 1425 mg  Document Released: 10/02/2005 Document Revised: 09/21/2011 Document Reviewed: 01/03/2010 [facility] Patient Information 2012 [place], [place].    Home Safety and Preventing Falls Falls are a leading cause of injury and while they affect all age groups, falls have greater short-term and long-term impact on older age groups. However, falls should not be a part of life or aging. It is possible for individuals and their families to use preventive measures to significantly decrease the likelihood that anyone, especially an older adult, will fall. There are many simple measures which can make your home safer with respect to preventing falls. The following actions can help reduce falls among all members of your family and are especially important as you age, when your balance, lower limb strength, coordination, and eyesight may be declining. The use of preventive measures will help to reduce you and your family's risk of falls and serious medical consequences. OUTDOORS  Repair cracks and edges of walkways and driveways.   Remove high doorway thresholds and trim shrubbery on the main path into your home.   Ensure there is good outside lighting at main entrances and along main walkways.   Clear walkways of tools, rocks, debris, and clutter.   Check that handrails are not broken and are securely fastened. Both sides of steps should have handrails.   In the garage, be attentive to and clean up grease or oil spills on the cement. This can make the surface extremely slippery.   In winter, have leaves, snow, and ice cleared regularly.   Use sand or salt on walkways during winter months.  BATHROOM  Install grab bars by the toilet and in the tub and shower.   Use non-skid mats or decals in the tub  or shower.   If unable to easily stand unsupported while showering, place a plastic non slip stool in the shower to sit on when needed.   Install night lights.   Keep floors dry and clean up all water on the floor immediately.   Remove soap buildup in tub or shower on a regular basis.   Secure bath mats with non-slip, double-sided rug tape.   Remove tripping hazards from the floors.  BEDROOMS  Install night lights.   Do not use oversized bedding.   Make sure a bedside light is easy to reach.   Keep a telephone by your bedside.   Make sure that you can get in and out of your bed easily.   Have a  firm chair, with side arms, to use for getting dressed.   Remove clutter from around closets.   Store clothing, bed coverings, and other household items where you can reach them comfortably.   Remove tripping hazards from the floor.  LIVING AREAS AND STAIRWAYS  Turn on lights to avoid having to walk through dark areas.   Keep lighting uniform in each room. Place brighter lightbulbs in darker areas, including stairways.   Replace lightbulbs that burn out in stairways immediately.   Arrange furniture to provide for clear pathways.   Keep furniture in the same place.   Eliminate or tape down electrical cables in high traffic areas.   Place handrails on both sides of stairways. Use handrails when going up or down stairs.   Most falls occur on the top or bottom 3 steps.   Fix any loose handrails. Make sure handrails on both sides of the stairways are as long as the stairs.   Remove all walkway obstacles.   Coil or tape electrical cords off to the side of walking areas and out of the way. If using many extension cords, have an electrician put in a new wall outlet to reduce or eliminate them.   Make sure spills are cleaned up quickly and allow time for drying before walking on freshly cleaned floors.   Firmly attach carpet with non-skid or two-sided tape.   Keep frequently  used items within easy reach.   Remove tripping hazards such as throw rugs and clutter in walkways. Never leave objects on stairs.   Get rid of throw rugs elsewhere if possible.   Eliminate uneven floor surfaces.   Make sure couches and chairs are easy to get into and out of.   Check carpeting to make sure it is firmly attached along stairs.   Make repairs to worn or loose carpet promptly.   Select a carpet pattern that does not visually hide the edge of steps.   Avoid placing throw rugs or scatter rugs at the top or bottom of stairways, or properly secure with carpet tape to prevent slippage.   Have an electrician put in a light switch at the top and bottom of the stairs.   Get light switches that glow.   Avoid the following practices: hurrying, inattention, obscured vision, carrying large loads, and wearing slip-on shoes.   Be aware of all pets.  KITCHEN  Place items that are used frequently, such as dishes and food, within easy reach.   Keep handles on pots and pans toward the center of the stove. Use back burners when possible.   Make sure spills are cleaned up quickly and allow time for drying.   Avoid walking on wet floors.   Avoid hot utensils and knives.   Position shelves so they are not too high or low.   Place commonly used objects within easy reach.   If necessary, use a sturdy step stool with a grab bar when reaching.   Make sure electrical cables are out of the way.   Do not use floor polish or wax that makes floors slippery.  OTHER HOME FALL PREVENTION STRATEGIES  Wear low heel or rubber sole shoes that are supportive and fit well.   Wear closed toe shoes.   Know and watch for side effects of medications. Have your caregiver or pharmacist look at all your medicines, even over-the-counter medicines. Some medicines can make you sleepy or dizzy.   Exercise regularly. Exercise makes you stronger and improves your  balance and coordination.   Limit use  of alcohol.   Use eyeglasses if necessary and keep them clean. Have your vision checked every year.   Organize your household in a manner that minimizes the need to walk distances when hurried, or go up and down stairs unnecessarily. For example, have a phone placed on at least each floor of your home. If possible, have a phone beside each sitting or lying area where you spend the most time at home. Keep emergency numbers posted at all phones.   Use non-skid floor wax.   When using a ladder, make sure:   The base is firm.   All ladder feet are on level ground.   The ladder is angled against the wall properly.   When climbing a ladder, face the ladder and hold the ladder rungs firmly.   If reaching, always keep your hips and body weight centered between the rails.   When using a stepladder, make sure it is fully opened and both spreaders are firmly locked.   Do not climb a closed stepladder.   Avoid climbing beyond the second step from the top of a stepladder and the 4th rung from the top of an extension ladder.   Learn and use mobility aids as needed.   Change positions slowly. Arise slowly from sitting and lying positions. Sit on the edge of your bed before getting to your feet.   If you have a history of falls, ask someone to add color or contrast paint or tape to grab bars and handrails in your home.   If you have a history of falls, ask someone to place contrasting color strips on first and last steps.   Install an electrical emergency response system if you need one, and know how to use it.   If you have a medical or other condition that causes you to have limited physical strength, it is important that you reach out to family and friends for occasional help.  FOR CHILDREN:  If young children are in the home, use safety gates. At the top of stairs use screw-mounted gates; use pressure-mounted gates for the bottom of the stairs and doorways between rooms.   Young children  should be taught to descend stairs on their stomachs, feet first, and later using the handrail.   Keep drawers fully closed to prevent them from being climbed on or pulled out entirely.   Move chairs, cribs, beds and other furniture away from windows.   Consider installing window guards on windows ground floor and up, unless they are emergency fire exits. Make sure they have easy release mechanisms.   Consider installing special locks that only allow the window to be opened to a certain height.   Never rely on window screens to prevent falls.   Never leave babies alone on changing tables, beds or sofas. Use a changing table that has a restraining strap.   When a child can pull to a standing position, the crib mattress should be adjusted to its lowest position. There should be at least 26 inches between the top rails of the crib drop side and the mattress. Toys, bumper pads, and other objects that can be used as steps to climb out should be removed from the crib.   On bunk beds never allow a child under age 17 to sleep on the top bunk. For older children, if the upper bunk is not against a wall, use guard rails on both sides. No matter how  old a child is, keep the guard rails in place on the top bunk since children roll during sleep. Do not permit horseplay on bunks.   Grass and soil surfaces beneath backyard playground equipment should be replaced with hardwood chips, shredded wood mulch, sand, pea gravel, rubber, crushed stone, or another safer material at depths of at least 9 to 12 inches.   When riding bikes or using skates, skateboards, skis, or snowboards, require children to wear helmets. Look for those that have stickers stating that they meet or exceed safety standards.   Vertical posts or pickets in deck, balcony, and stairway railings should be no more than 3 1/2 inches apart if a young baby will have access to the area. The space between horizontal rails or bars, and between the floor  and the first horizontal rail or bar, should be no more than 3 1/2 inches.  Document Released: 09/22/2002 Document Revised: 09/21/2011 Document Reviewed: 07/22/2009 [facility] Patient Information 2012 [place], [place].

## 2012-01-08 NOTE — Progress Notes (Signed)
  Subjective:    Patient ID: Anthony Stark, male    DOB: 11/12/1934, 77 y.o.   MRN: [id number]  HPI Says had a nurse visit from his health insurance company.  Home BPs were 160/90 x 2 at home.  HR 76, po2 98%.  Says he has felt well.  BP was well  Controlled.  Has dec his alcohol to 2 gasses of wine a day.  Still walking daily.  No CP or SOB, compliant w/ meds.    Review of Systems     Objective:   Physical Exam  Constitutional: He is oriented to person, place, and time. He appears well-developed and well-nourished.  HENT:  Head: Normocephalic and atraumatic.  Cardiovascular: Normal rate, regular rhythm and normal heart sounds.   Pulmonary/Chest: Effort normal and breath sounds normal.  Musculoskeletal: He exhibits no edema.  Neurological: He is alert and oriented to person, place, and time.  Skin: Skin is warm and dry.  Psychiatric: He has a normal mood and affect. His behavior is normal.          Assessment & Plan:  HTN - continue reducing alcohol.  Also dicussed the importance of low salt diet.  Given information. BP at goal today. Discussed conisdering getting a home cuff.    Reminded him he is due for colonoscopy. He says he weill call.   Depression screen. PHQ-9 score of 1, neg for depression.   Fall assessment - Score of 10 today. He has had one fall over a paver in his yard.  Will mail him handout on fall prevention in the home. 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

## 2012-01-08 NOTE — Progress Notes (Signed)
Addended by: Wyline Beady on: 01/08/2012 11:44 AM   Modules accepted: Orders

## 2012-01-10 ENCOUNTER — Telehealth: Payer: Self-pay | Admitting: *Deleted

## 2012-01-10 MED ORDER — PREDNISONE 20 MG PO TABS: 40.0000 mg | ORAL_TABLET | Freq: Every day | ORAL | Status: DC

## 2012-01-10 NOTE — Telephone Encounter (Signed)
Where is the pinched nerve? Back or neck?  Any numbness? What has he already tried OTC.  Usually we use a course of steroid for this.

## 2012-01-10 NOTE — Telephone Encounter (Signed)
I will call in course of prednisone. Can also use tylenol with the prednisone. If not better by Monday, then make appt.

## 2012-01-10 NOTE — Telephone Encounter (Signed)
Feels it is in lower spine causing pain in the leg in 2 spots (place in thigh and place on shin). States that it comes and goes. States more weight on back then it makes pain in lower leg worse. States he has taken aspirin OTC. Pt states he can come in tomorrow if needs to. Please advise.

## 2012-01-10 NOTE — Telephone Encounter (Signed)
Pt informed

## 2012-01-10 NOTE — Telephone Encounter (Signed)
Pt states he has a pinched nerve and is requesting something be sent in for pain. States it wasn't that bad when he seen you on Monday. Please advise.

## 2012-01-11 ENCOUNTER — Ambulatory Visit
Admission: RE | Admit: 2012-01-11 | Discharge: 2012-01-11 | Disposition: A | Discharge status: Home / Self Care | Payer: Medicare PPO | Source: Ambulatory Visit | Attending: Family Medicine | Admitting: Family Medicine

## 2012-01-11 ENCOUNTER — Telehealth: Payer: Self-pay | Admitting: Family Medicine

## 2012-01-11 ENCOUNTER — Ambulatory Visit (INDEPENDENT_AMBULATORY_CARE_PROVIDER_SITE_OTHER): Payer: Medicare PPO | Admitting: Family Medicine

## 2012-01-11 ENCOUNTER — Encounter: Payer: Self-pay | Admitting: Family Medicine

## 2012-01-11 VITALS — BP 145/76 | HR 78 | Temp 98.2°F | Ht 69.5 in | Wt 191.0 lb

## 2012-01-11 DIAGNOSIS — M5430 Sciatica, unspecified side: Secondary | ICD-10-CM

## 2012-01-11 DIAGNOSIS — M533 Sacrococcygeal disorders, not elsewhere classified: Secondary | ICD-10-CM

## 2012-01-11 MED ORDER — KETOROLAC TROMETHAMINE 60 MG/2ML IM SOLN
60.0000 mg | Freq: Once | INTRAMUSCULAR | Status: AC
Start: 2012-01-11 — End: 2012-01-11
  Administered 2012-01-11: 60 mg via INTRAMUSCULAR

## 2012-01-11 MED ORDER — ORPHENADRINE CITRATE ER 100 MG PO TB12: 100.0000 mg | ORAL_TABLET | Freq: Two times a day (BID) | ORAL | Status: DC

## 2012-01-11 MED ORDER — HYDROCODONE-ACETAMINOPHEN 7.5-325 MG PO TABS: 1.0000 | ORAL_TABLET | Freq: Four times a day (QID) | ORAL | Status: DC | PRN

## 2012-01-11 MED ORDER — MELOXICAM 15 MG PO TABS: 15.0000 mg | ORAL_TABLET | Freq: Every day | ORAL | Status: DC

## 2012-01-11 MED ORDER — KETOROLAC TROMETHAMINE 60 MG/2ML IM SOLN: 60.0000 mg | Freq: Once | INTRAMUSCULAR | Status: DC

## 2012-01-11 NOTE — Telephone Encounter (Signed)
Ok to put on wade schedule for back pain and pinched nerve.

## 2012-01-11 NOTE — Telephone Encounter (Signed)
Pt called states that he needs to be seen today because he has a pinched nerve that has gotten worse he was given a script sent to pharmacy and he just took the script last night and advised that he was instructed to take tylenol but that is not strong enough and he request to know if you can send in something stronger.Anthony KitchenHe said on Monday's visit that he wasn't seen for the pinch nerve because he thought the pain would go away but it has gotten worse.. And he is almost demanding that he be seen today due to a long weeknd coming and he cant wait until Monday for an appointment.  Pt request a call back

## 2012-01-11 NOTE — Patient Instructions (Signed)

## 2012-01-11 NOTE — Progress Notes (Signed)
  Subjective:    Patient ID: Anthony Stark, male    DOB: 1935/09/02, 77 y.o.   MRN: [id number]  Back Pain This is a new problem. The current episode started in the past 7 days (Started on Monday. the pain started on Monday .). The problem occurs intermittently. The problem has been gradually worsening since onset. The pain is present in the lumbar spine. The pain radiates to the left thigh and left knee. The pain is at a severity of 10/10. The pain is severe. The symptoms are aggravated by position and standing. Pertinent negatives include no abdominal pain, bladder incontinence, bowel incontinence, dysuria or perianal numbness. Risk factors: started on steroids on Wednesday. Treatments tried: steroid pack called on Wednesday. The treatment provided mild relief.    Should be noted patient was having difficulty sleeping at night and has been utilizing some of his wife 7.5/500 hydrocodone tablets.  Review of Systems  Gastrointestinal: Negative for abdominal pain and bowel incontinence.  Genitourinary: Negative for bladder incontinence and dysuria.  Musculoskeletal: Positive for back pain.      BP 145/76  Pulse 78  Temp(Src) 98.2 F (36.8 C) (Oral)  Ht 5' 9.5" (1.765 m)  Wt 191 lb (86.637 kg)  BMI 27.80 kg/m2  SpO2 96% is Objective:   Physical Exam  Vitals reviewed. Constitutional: He is oriented to person, place, and time. He appears well-developed.  HENT:  Head: Normocephalic.  Musculoskeletal: He exhibits tenderness. He exhibits no edema.       Lumbar back: He exhibits tenderness, pain and spasm.       Back:  Neurological: He is alert and oriented to person, place, and time. He has normal reflexes.  Skin: Skin is warm and dry.  Psychiatric: He has a normal mood and affect. His behavior is normal.          Assessment & Plan:                   Sciatica, low back pain, sacral ileitis. Toradol 60 mg IM. Continue with his oral prednisone. Mobic 15 mg by mouth daily cannot take Motrin or Aleve. Norflex 100 mg one tablet twice a day Hydrocodone 7.5/325 #20 use mostly at night to help with sleep. X-ray lumbar spine and pelvis if not better in a week followup with Dr. Eppie Gibson because may need MRI.

## 2012-01-15 ENCOUNTER — Ambulatory Visit (INDEPENDENT_AMBULATORY_CARE_PROVIDER_SITE_OTHER): Payer: Medicare PPO | Admitting: Family Medicine

## 2012-01-15 ENCOUNTER — Encounter: Payer: Self-pay | Admitting: Family Medicine

## 2012-01-15 VITALS — BP 145/80 | HR 78 | Ht 69.5 in | Wt 193.0 lb

## 2012-01-15 DIAGNOSIS — M5430 Sciatica, unspecified side: Secondary | ICD-10-CM

## 2012-01-15 DIAGNOSIS — M5116 Intervertebral disc disorders with radiculopathy, lumbar region: Secondary | ICD-10-CM

## 2012-01-15 DIAGNOSIS — M5126 Other intervertebral disc displacement, lumbar region: Secondary | ICD-10-CM

## 2012-01-15 DIAGNOSIS — M533 Sacrococcygeal disorders, not elsewhere classified: Secondary | ICD-10-CM

## 2012-01-15 MED ORDER — HYDROCODONE-ACETAMINOPHEN 7.5-325 MG PO TABS: 1.0000 | ORAL_TABLET | Freq: Four times a day (QID) | ORAL | Status: AC | PRN

## 2012-01-15 MED ORDER — CYCLOBENZAPRINE HCL 10 MG PO TABS: 10.0000 mg | ORAL_TABLET | Freq: Every evening | ORAL | Status: AC | PRN

## 2012-01-15 NOTE — Progress Notes (Signed)
  Subjective:    Patient ID: Anthony Stark, male    DOB: 1935/09/04, 77 y.o.   MRN: [id number]  HPI Started with leg pains about a week ago.  No sign low back pain.  Pain in the left lwoer outer leg.  Says feels like it is undulating. Worse with walking 10 feet.  Painful t stand up straight. Today was the first day could lay flat on his back. Had toradol injection 5 days ago. Had course of oral steorids.  Xray showed herniated disc at L5-S1. Today in wheelchair.  Has also tried meloxicam. Also the muscle relaxer was not covered by his insuance.   Review of Systems     Objective:   Physical Exam Patient able to stand up straight but unable to walk completely erect.  He is in no acute distress. He is nontender over his outer leg. His hip, knee, ankle strength on the left is 5 out of 5 and symmetric. Patellar reflexes are 1+ bilaterally. He is sitting in a wheelchair today.       Assessment & Plan:  Disc herniation  Of lumbar spine - we reviewed his x-ray results. Discussed options of rest and work on inflammation with the meloxicam, can use the vicodin as needed and give it one more week for gong ahead with MRI. Discussed pros and cons. Will give it one more week. Can call if not better adn will get the MRI.  I will change the muscle extra Flexeril to see if it might be better covered by his insurance. Consider physical therapy once he is better versus an orthopedic or neurosurgery referral if we do need to get an MRI and if it shows any significant nerve impingement.

## 2012-01-15 NOTE — Patient Instructions (Addendum)
Call me in one week after rest and let me know.  If not better will get the MRI.   Meloxicam once a day.  Can use the hydrocodone as needed.

## 2012-01-22 ENCOUNTER — Ambulatory Visit: Payer: Medicare PPO | Admitting: Family Medicine

## 2012-01-24 ENCOUNTER — Telehealth: Payer: Self-pay | Admitting: *Deleted

## 2012-01-24 DIAGNOSIS — M5410 Radiculopathy, site unspecified: Secondary | ICD-10-CM

## 2012-01-24 DIAGNOSIS — M5126 Other intervertebral disc displacement, lumbar region: Secondary | ICD-10-CM

## 2012-01-24 DIAGNOSIS — M545 Low back pain, unspecified: Secondary | ICD-10-CM

## 2012-01-24 NOTE — Telephone Encounter (Signed)
Pt called and states he wants to go ahead with the MRI

## 2012-01-24 NOTE — Telephone Encounter (Signed)
MRI order palced. They should call him probably tomorrow with the appointment time.

## 2012-01-26 ENCOUNTER — Telehealth: Payer: Self-pay | Admitting: *Deleted

## 2012-01-26 NOTE — Telephone Encounter (Signed)
PA number for MRI lumbar spine w/o contrast  [phone]. Called [facility] and left message on vickie's vm.( PA coordinator) [phone]

## 2012-01-27 ENCOUNTER — Ambulatory Visit
Admission: RE | Admit: 2012-01-27 | Discharge: 2012-01-27 | Disposition: A | Discharge status: Home / Self Care | Payer: Medicare PPO | Source: Ambulatory Visit | Attending: Family Medicine | Admitting: Family Medicine

## 2012-01-27 DIAGNOSIS — M545 Low back pain: Secondary | ICD-10-CM

## 2012-01-27 DIAGNOSIS — M5126 Other intervertebral disc displacement, lumbar region: Secondary | ICD-10-CM

## 2012-01-27 DIAGNOSIS — M5410 Radiculopathy, site unspecified: Secondary | ICD-10-CM

## 2012-01-29 ENCOUNTER — Telehealth: Payer: Self-pay | Admitting: Family Medicine

## 2012-01-29 DIAGNOSIS — N2889 Other specified disorders of kidney and ureter: Secondary | ICD-10-CM

## 2012-01-29 DIAGNOSIS — M4800 Spinal stenosis, site unspecified: Secondary | ICD-10-CM

## 2012-01-29 DIAGNOSIS — IMO0002 Reserved for concepts with insufficient information to code with codable children: Secondary | ICD-10-CM

## 2012-01-29 NOTE — Telephone Encounter (Signed)
I called and discussed his results of his MRI of the spine as well as the findings on his kidneys. We will schedule him for an MRI with and without contrast of the kidneys and also schedule him with a neurosurgeon for further evaluation of his back. He does because of scheduling conflicts and his wife's appointments he will not be able to go this week but can get either Monday or Tuesday of next week.

## 2012-01-30 ENCOUNTER — Ambulatory Visit: Payer: Medicare PPO | Admitting: Family Medicine

## 2012-02-01 ENCOUNTER — Telehealth: Payer: Self-pay | Admitting: *Deleted

## 2012-02-01 DIAGNOSIS — N2889 Other specified disorders of kidney and ureter: Secondary | ICD-10-CM

## 2012-02-01 NOTE — Telephone Encounter (Signed)
Ok, will try it.  New order placed.

## 2012-02-01 NOTE — Telephone Encounter (Signed)
Sherry at [facility] said if you put in MR and not MRI then more options come up

## 2012-02-01 NOTE — Telephone Encounter (Signed)
Yes, needs both for with and withou contrast.

## 2012-02-01 NOTE — Telephone Encounter (Signed)
[facility] wants to know if you need MRI w/ or w/o contrast and if you can change it because it looks like there needs to be two MRI's scheduled

## 2012-02-05 ENCOUNTER — Telehealth: Payer: Self-pay | Admitting: *Deleted

## 2012-02-05 NOTE — Telephone Encounter (Signed)
PA for MR of abd w/wo contrast [phone].left message on christie's vm at [facility] [phone]

## 2012-02-08 ENCOUNTER — Ambulatory Visit
Admission: RE | Admit: 2012-02-08 | Discharge: 2012-02-08 | Disposition: A | Discharge status: Home / Self Care | Payer: Medicare PPO | Source: Ambulatory Visit | Attending: Family Medicine | Admitting: Family Medicine

## 2012-02-08 ENCOUNTER — Other Ambulatory Visit: Payer: Self-pay | Admitting: *Deleted

## 2012-02-08 DIAGNOSIS — M5430 Sciatica, unspecified side: Secondary | ICD-10-CM

## 2012-02-08 DIAGNOSIS — M533 Sacrococcygeal disorders, not elsewhere classified: Secondary | ICD-10-CM

## 2012-02-08 MED ORDER — GADOBENATE DIMEGLUMINE 529 MG/ML IV SOLN
20.0000 mL | Freq: Once | INTRAVENOUS | Status: AC | PRN
  Administered 2012-02-08: 20 mL via INTRAVENOUS

## 2012-02-08 MED ORDER — HYDROCODONE-ACETAMINOPHEN 7.5-325 MG PO TABS: 1.0000 | ORAL_TABLET | Freq: Four times a day (QID) | ORAL | Status: AC | PRN

## 2012-02-12 ENCOUNTER — Other Ambulatory Visit: Payer: Self-pay | Admitting: Family Medicine

## 2012-02-12 ENCOUNTER — Telehealth: Payer: Self-pay | Admitting: Hematology & Oncology

## 2012-02-12 NOTE — Telephone Encounter (Signed)
Dr. Myna Hidalgo aware Urologist appointment cancelled. He will let me know when to schedule pt.

## 2012-02-13 ENCOUNTER — Telehealth: Payer: Self-pay | Admitting: Hematology & Oncology

## 2012-02-13 NOTE — Telephone Encounter (Signed)
Pt aware of 5-2 appointment °

## 2012-02-15 ENCOUNTER — Ambulatory Visit: Payer: Medicare PPO

## 2012-02-15 ENCOUNTER — Other Ambulatory Visit (HOSPITAL_BASED_OUTPATIENT_CLINIC_OR_DEPARTMENT_OTHER): Payer: Medicare PPO | Admitting: Lab

## 2012-02-15 ENCOUNTER — Ambulatory Visit (HOSPITAL_BASED_OUTPATIENT_CLINIC_OR_DEPARTMENT_OTHER): Payer: Medicare PPO | Admitting: Hematology & Oncology

## 2012-02-15 VITALS — BP 137/79 | HR 96 | Temp 97.4°F | Ht 70.0 in | Wt 186.0 lb

## 2012-02-15 DIAGNOSIS — Z8546 Personal history of malignant neoplasm of prostate: Secondary | ICD-10-CM

## 2012-02-15 DIAGNOSIS — C649 Malignant neoplasm of unspecified kidney, except renal pelvis: Secondary | ICD-10-CM

## 2012-02-15 DIAGNOSIS — M48061 Spinal stenosis, lumbar region without neurogenic claudication: Secondary | ICD-10-CM

## 2012-02-15 LAB — CBC WITH DIFFERENTIAL (CANCER CENTER ONLY)
BASO#: 0 10*3/uL (ref 0.0–0.2)
Eosinophils Absolute: 0.1 10*3/uL (ref 0.0–0.5)
HCT: 44.1 % (ref 38.7–49.9)
HGB: 15.1 g/dL (ref 13.0–17.1)
LYMPH#: 1.4 10*3/uL (ref 0.9–3.3)
MCHC: 34.2 g/dL (ref 32.0–35.9)
NEUT#: 3.8 10*3/uL (ref 1.5–6.5)
NEUT%: 62.4 % (ref 40.0–80.0)
RBC: 4.84 10*6/uL (ref 4.20–5.70)

## 2012-02-15 LAB — COMPREHENSIVE METABOLIC PANEL
Albumin: 4.9 g/dL (ref 3.5–5.2)
BUN: 15 mg/dL (ref 6–23)
CO2: 33 mEq/L — ABNORMAL HIGH (ref 19–32)
Calcium: 9.9 mg/dL (ref 8.4–10.5)
Chloride: 101 mEq/L (ref 96–112)
Creatinine, Ser: 0.81 mg/dL (ref 0.50–1.35)
Glucose, Bld: 91 mg/dL (ref 70–99)
Potassium: 4.1 mEq/L (ref 3.5–5.3)

## 2012-02-15 LAB — LACTATE DEHYDROGENASE: LDH: 117 U/L (ref 94–250)

## 2012-02-15 NOTE — Progress Notes (Signed)
This office note has been dictated.

## 2012-02-16 NOTE — Progress Notes (Signed)
CC:   Nani Gasser, M.D. Veverly Fells. Vernie Ammons, M.D. Kendell Bane, MD  DIAGNOSIS:  Probable renal cell carcinoma of the right kidney.  HISTORY OF PRESENT ILLNESS:  Mr. Winsor is a really nice 77 year old white gentleman.  The amazing thing about Mr. Molina is that he graduated from [employer] back in, I think 1960.  I graduated in 1982.  We had a good time talking about [facility].  He is followed by Dr. Linford Arnold.  He has had a lot of back issues.  He sees Dr. Alvester Morin in [place].  Of note, Mr. Antenucci also has a history of localized prostate cancer.  He had stage IIA disease.  He was seen initially by Dr. Ihor Gully.  He had a PSA of 15.18.  He underwent biopsies which showed a Gleason score of 7.  He was felt to be a good candidate for radiotherapy and seed implant.  He also was placed on androgen depravation.  He received degarelix.  He had 3 gold seeds implanted.  He underwent tomotherapy.  He must have been 1 of the 1st guys to get on the new machine.  He received a total of 7800 rads to the prostate and 5600 rads to the seminal vesicles.  He continued his androgen deprivation for, I think another 9 months afterwards.  Since then, he has had no problems with his prostate.  His PSA gets very low.  Unfortunately, he has been having more problems with his back.  He underwent an MRI.  This, I think was done on the 26th.  He had a previous MRI on the 13th of April.  Shockingly enough, the MRI showed that he had a couple of lesions in the right kidney.  He had an upper pole right kidney lesion measuring 3.1 x 3.2 cm.  He had a 2nd lesion that was somewhat exophytic off the lower pole of the right kidney measuring 4.4 x 3.6 cm.  There was no evidence of renal vein invasion. There was no obvious abdominal adenopathy or ascites.  The liver looked okay.  There was a normal heart without pericardial effusions.  Dr. Linford Arnold was very kind to call me regarding this.  I wanted to  see Mr. Waldridge so I could discuss with him what needed to be done.  Again, he is mostly bothered by his lumbar spinal stenosis.  This is making it very, very difficult for him to even walk.  He comes in a wheelchair.  Overall he is in good health.  He has had no problems with weight loss. His appetite has been okay.  He has had no nausea or vomiting.  He has had no fevers, sweats, or chills.  There has been no headache.  There has been no cough.  He has had no leg swelling.  There have been no rashes.  PAST MEDICAL HISTORY: 1. Remarkable for, I think hyperlipidemia. 2. Hypertension.  ALLERGIES:  None.  MEDICATIONS: 1. Flexeril 10 mg p.o. t.i.d. p.r.n. 2. Vicodin and Norco (7.5/325) one p.o. as needed. 3. Accuretic (10/12.5) one p.o. daily. 4. Zocor 20 mg p.o. daily. 5. Mobic 15 mg p.o. daily. 6. Prednisone 20 mg p.o. daily.  SOCIAL HISTORY:  Remarkable for past tobacco use.  He has not smoked for several years.  There is rare alcohol use.  He is a retired college history professor.  FAMILY HISTORY:  Negative for malignancies.  REVIEW OF SYSTEMS:  As stated in history of present illness.  No additional findings are noted  on a 12-system review.  PHYSICAL EXAMINATION:  General:  This is a well-developed, well- nourished white gentleman in no obvious distress.  Vital Signs:  Show a temperature of 97.4, pulse 96, respiratory rate 18, blood pressure 137/79, weight is 186.  Head and Neck Exam:  Shows a normocephalic, atraumatic skull.  There are no ocular or oral lesions.  There are no palpable cervical or supraclavicular lymph nodes.  Lungs:  Clear bilaterally.  Cardiac Exam:  Regular rate and rhythm with a normal S1 and S2.  There are no murmurs, rubs, or bruits.  Abdominal Exam:  Soft with good bowel sounds.  There is no fluid wave.  There is no guarding or rebound tenderness.  There is no fullness over on the right side. There is no palpable hepatosplenomegaly.  Back Exam:   Shows no tenderness over the spine, ribs, or hips.  Extremities:  Show:  No clubbing, cyanosis, or edema.  Skin Exam:  No rashes, ecchymoses, or petechia.  Neurological Exam:  Shows no focal neurological deficits.  LABORATORY STUDIES:  White cell count is 6, hemoglobin 15.1, hematocrit 44.1, platelet count is 234.  IMPRESSION:  Mr. 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  He appears to have a multifocal renal cell carcinoma of the right kidney.  There are no biopsies that have been done, but I think this is quite obvious as to the likely diagnosis.  I do not see any evidence of metastatic disease.  He probably does need to have a CT of the chest done.  There are no neurological symptoms that I can see.  I would not order an MRI of the brain unless we surprisingly find mets to his lung.  I think that even if he does have metastatic disease, he would be a very good candidate for a right nephrectomy.  I suspect that he will probably have to have the whole kidney removed.  I spoke to Dr. Ihor Gully.  Dr. Vernie Ammons will see Mr. Bochicchio next week and try to move his surgery along if he feels that surgery is indicated.  Again, I do not see any issues with Mr. Raia having surgery.  I think once he has surgery, he likely is going to need to get his back fixed. This really is his main problem right now.  I would not think that there is any indication for systemic therapy for Mr. Lohmeyer given that I do not feel that he has any metastatic disease.  I spent a good hour and a half with Mr. and Mrs. Hart Rochester.  It was really nice talking with him.  I will plan to see him back in another 6 weeks or so.  By then, he will hopefully also have his back fixed.    ______________________________ Josph Macho, M.D. PRE/MEDQ  D:  02/15/2012  T:  02/16/2012  Job:  [id number]

## 2012-02-19 ENCOUNTER — Ambulatory Visit (HOSPITAL_BASED_OUTPATIENT_CLINIC_OR_DEPARTMENT_OTHER)
Admission: RE | Admit: 2012-02-19 | Discharge: 2012-02-19 | Disposition: A | Discharge status: Home / Self Care | Payer: Medicare PPO | Source: Ambulatory Visit | Attending: Hematology & Oncology | Admitting: Hematology & Oncology

## 2012-02-19 ENCOUNTER — Other Ambulatory Visit: Payer: Self-pay | Admitting: Urology

## 2012-02-19 DIAGNOSIS — M549 Dorsalgia, unspecified: Secondary | ICD-10-CM | POA: Insufficient documentation

## 2012-02-19 DIAGNOSIS — Z87891 Personal history of nicotine dependence: Secondary | ICD-10-CM

## 2012-02-19 DIAGNOSIS — C801 Malignant (primary) neoplasm, unspecified: Secondary | ICD-10-CM

## 2012-02-19 DIAGNOSIS — C649 Malignant neoplasm of unspecified kidney, except renal pelvis: Secondary | ICD-10-CM | POA: Insufficient documentation

## 2012-02-19 MED ORDER — IOHEXOL 300 MG/ML  SOLN: 80.0000 mL | Freq: Once | INTRAMUSCULAR | Status: AC | PRN

## 2012-02-21 ENCOUNTER — Encounter: Payer: Self-pay | Admitting: Family Medicine

## 2012-02-21 DIAGNOSIS — M4800 Spinal stenosis, site unspecified: Secondary | ICD-10-CM | POA: Insufficient documentation

## 2012-02-21 DIAGNOSIS — C661 Malignant neoplasm of right ureter: Secondary | ICD-10-CM | POA: Insufficient documentation

## 2012-02-23 ENCOUNTER — Encounter (HOSPITAL_COMMUNITY)
Admission: RE | Admit: 2012-02-23 | Discharge: 2012-02-23 | Disposition: A | Discharge status: Home / Self Care | Payer: Medicare PPO | Source: Ambulatory Visit | Attending: Urology | Admitting: Urology

## 2012-02-23 ENCOUNTER — Encounter (HOSPITAL_COMMUNITY): Payer: Self-pay

## 2012-02-23 ENCOUNTER — Encounter (HOSPITAL_COMMUNITY): Payer: Self-pay | Admitting: Pharmacy Technician

## 2012-02-23 HISTORY — DX: Essential (primary) hypertension: I10

## 2012-02-23 HISTORY — DX: Unspecified osteoarthritis, unspecified site: M19.90

## 2012-02-23 HISTORY — DX: Chronic kidney disease, unspecified: N18.9

## 2012-02-23 LAB — CBC
Hemoglobin: 14.5 g/dL (ref 13.0–17.0)
MCHC: 33.6 g/dL (ref 30.0–36.0)
WBC: 6.1 10*3/uL (ref 4.0–10.5)

## 2012-02-23 LAB — BASIC METABOLIC PANEL
BUN: 10 mg/dL (ref 6–23)
Chloride: 100 mEq/L (ref 96–112)
GFR calc Af Amer: >90 mL/min (ref >90)
GFR calc non Af Amer: >90 mL/min (ref >90)
Potassium: 3.7 mEq/L (ref 3.5–5.1)
Sodium: 137 mEq/L (ref 135–145)

## 2012-02-23 LAB — SURGICAL PCR SCREEN
MRSA, PCR: NEGATIVE
Staphylococcus aureus: NEGATIVE

## 2012-02-23 MED ORDER — CEFAZOLIN SODIUM 1-5 GM-% IV SOLN: 1.0000 g | INTRAVENOUS | Status: DC

## 2012-02-23 NOTE — H&P (Signed)
Chief Complaint  Right renal neoplasms   History of Present Illness    [name] is a 77 year old gentleman seen at the request of Dr. Arlan Organ for 2 right renal masses. He has a history of prostate cancer and actually had seen him in consultation 2009 when he was considering his treatment options. He ultimately was treated with radiation therapy and has been disease-free from his prostate cancer. He initially was followed by Dr. Vernie Ammons and due to insurance reasons has more recently been followed by Dr. Luane School at [facility]. He is interested in following up with Dr. Vernie Ammons going forward if his insurance allows.  He began developing severe back pain symptoms with radiation down his left lower extremity a few weeks ago. He underwent an MRI of the spine in mid April which demonstrated lumbar stenosis and incidentally detected concerning right renal lesions. He has had no hematuria and denies any abdominal or flank pain. He has no family history of renal cell carcinoma or end-stage renal disease.  He underwent a dedicated MRI of the abdomen on 02/08/12 for further evaluation which indeed demonstrated 2 enhancing right renal neoplasms concerning for renal cell carcinoma. One lesion on the posterior upper pole measured 3.2 x 3.1 cm. The other lesion in the lower pole medially and abutting the renal hilum measured 4.4 x 3.6 cm. Each of these lesions was clearly enhancing and consistent with renal cell carcinoma. No associated regional lymphadenopathy, renal vein, or inferior vena cava involvement was identified. No other evidence of metastatic disease to the abdomen was identified. The contralateral kidney appeared normal. No abnormal adrenal lesions were noted. He has not yet undergone chest imaging although has a chest CT scan pending for Monday it was ordered by Dr. Myna Hidalgo. He also appears to have 2 right renal arteries with an accessory lower pole renal artery.    Past Medical History Problems    1. History of  Epididymitis Left 604.90 2. History of  Hypercholesterolemia 272.0 3. History of  Hypertension 401.9 4. Prostate Cancer 185  Surgical History Problems  1. History of  Inguinal Hernia Repair Right 2. History of  Leg Repair  Current Meds 1. Hydrocodone-Acetaminophen 7.5-325 MG Oral Tablet; Therapy: 25Apr2013 to 2. Quinapril-Hydrochlorothiazide 20-25 MG Oral Tablet; Therapy: (Recorded:01Jun2009) to 3. Zocor 40 MG Oral Tablet; Therapy: (Recorded:01Jun2009) to  Allergies Medication  1. No Known Drug Allergies  Family History Problems  1. Family history of  Death In The Family Father 57- accidental 2. Family history of  Death In The Family Mother 75 Denied  3. Family history of  Prostate Cancer  Social History Problems    Alcohol Use x2-3 daily   Marital History - Currently Married   Occupation: retired   Tobacco Use V15.82 1ppd x 12 years=quit and the smoked again <1ppd x 30 years=quit 7 years  Review of Systems Constitutional, skin, eye, otolaryngeal, hematologic/lymphatic, cardiovascular, pulmonary, endocrine, musculoskeletal, gastrointestinal, neurological and psychiatric system(s) were reviewed and pertinent findings if present are noted.    Vitals Vital Signs [Data Includes: Last 1 Day]  03May2013 11:30AM  BMI Calculated: 27.2 BSA Calculated: 2.04 Height: 5 ft 10 in Weight: 190 lb  Blood Pressure: 150 / 95 Heart Rate: 89  Physical Exam Constitutional: Well nourished and well developed . No acute distress.  ENT:. The ears and nose are normal in appearance.  Neck: The appearance of the neck is normal and no neck mass is present.  Pulmonary: No respiratory distress, normal respiratory rhythm and effort and  clear bilateral breath sounds.  Cardiovascular: Heart rate and rhythm are normal . No peripheral edema.  Abdomen: The abdomen is soft and nontender. No masses are palpated. No CVA tenderness. No hernias are palpable. No hepatosplenomegaly  noted.  Lymphatics: The supraclavicular, femoral and inguinal nodes are not enlarged or tender.  Skin: Normal skin turgor, no visible rash and no visible skin lesions.  Neuro/Psych:. Mood and affect are appropriate.    Results/Data Urine [Data Includes: Last 1 Day]   03May2013  COLOR YELLOW   APPEARANCE CLEAR   SPECIFIC GRAVITY 1.015   pH 7.5   GLUCOSE NEG mg/dL  BILIRUBIN NEG   KETONE NEG mg/dL  BLOOD NEG   PROTEIN NEG mg/dL  UROBILINOGEN 0.2 mg/dL  NITRITE NEG   LEUKOCYTE ESTERASE NEG     I independently reviewed his medical records, MRI results, and laboratory studies. His MRI results are as dictated above. His serum creatinine is 0.81 and his alkaline phosphatase and LFTs are all within normal limits. Hemoglobin 15.1.   Procedure  I did have Anthony Stark bend as far as he could to the left and simulate the position we would have him and at the time of surgery. This did not appear to exacerbate his back pain symptoms.     Assessment Assessed  1. Renal Neoplasm 239.5  Plan Health Maintenance (V70.0)  1. UA With REFLEX  Done: 03May2013 11:21AM Renal Neoplasm (239.5)  2. Follow-up Schedule Surgery Office  Follow-up  Done: 03May2013  Discussion/Summary  1. Right renal neoplasms: I had a detailed discussion with [name] and explained that his lesions on MRI are consistent with multifocal primary renal cell carcinomas. He does need to complete his chest imaging to further rule out the possibility of metastases. I have recommended that we tentatively plan to proceed with a right laparoscopic radical nephrectomy considering his multifocal lesions as well as the fact he has normal contralateral kidney and normal renal function.   The patient was provided information regarding their renal mass including the relative risk of benign versus malignant pathology and the natural history of renal cell carcinoma and other possible malignancies of the kidney. The role of  renal biopsy, laboratory testing, and imaging studies to further characterize renal masses and/or the presence of metastatic disease were explained. We discussed the role of active surveillance, surgical therapy with both radical nephrectomy and nephron-sparing surgery, and ablative therapy in the treatment of renal masses. In addition, we discussed our goals of providing an accurate diagnosis and oncologic control while maintaining optimal renal function as appropriate based on the size, location, and complexity of their renal mass as well as their co-morbidities.    We have discussed the risks of treatment in detail including but not limited to bleeding, infection, heart attack, stroke, death, venothromoboembolism, cancer recurrence, injury/damage to surrounding organs and structures, urine leak, the possibility of open surgical conversion for patients undergoing minimally invasive surgery, the risk of developing chronic kidney disease and its associated implications, and the potential risk of end stage renal disease possibly necessitating dialysis.     He has a good understanding of his situation at this point and is agreeable to proceed as planned. He gives his informed consent.  CC: Dr. Alden Hipp Dr. Ihor Gully Dr. Arlan Organ Dr. Kendell Bane - [place], [facility]    SignaturesElectronically signed by : Heloise Purpura, M.D.; Feb 16 2012 12:25PM

## 2012-02-23 NOTE — Progress Notes (Signed)
02/23/12 1512  OBSTRUCTIVE SLEEP APNEA  Have you ever been diagnosed with sleep apnea through a sleep study? Yes  If yes, do you have and use a CPAP or BPAP machine every night? 0  Do you snore loudly (loud enough to be heard through closed doors)?  1  Do you often feel tired, fatigued, or sleepy during the daytime? 0  Has anyone observed you stop breathing during your sleep? 1  Do you have, or are you being treated for high blood pressure? 1  BMI more than 35 kg/m2? 0  Age over 59 years old? 1  Neck circumference greater than 40 cm/18 inches? 0  Gender: 1  Obstructive Sleep Apnea Score 5   Score 4 or greater  Updated health history

## 2012-02-23 NOTE — Patient Instructions (Signed)
22 Anthony Stark  02/23/2012   Your procedure is scheduled on:  02/26/12 1100am-130pm  Report to [facility] at 0830 AM.  Call this number if you have problems the morning of surgery: [phone]   Remember:   Do not eat food:After Midnight.  May have clear liquids:until Midnight .  Marland Kitchen  Take these medicines the morning of surgery with A SIP OF WATER:    Do not wear jewelry  Do not wear lotions, powders, or perfumes  .  Do not bring valuables to the hospital.  Contacts, dentures or bridgework may not be worn into surgery.  Leave suitcase in the car. After surgery it may be brought to your room.  For patients admitted to the hospital, checkout time is 11:00 AM the day of discharge.       Special Instructions: CHG Shower Use Special Wash: 1/2 bottle night before surgery and 1/2 bottle morning of surgery. shower chin to toes with CHG. Wash face and private parts with regular soap.   Please read over the following fact sheets that you were given: MRSA Information, Incentive Spirometry fact sheet, Blood Transfusion Fact Sheet, coughing and deep breathing exercises

## 2012-02-26 ENCOUNTER — Telehealth: Payer: Self-pay | Admitting: *Deleted

## 2012-02-26 ENCOUNTER — Encounter (HOSPITAL_COMMUNITY)
Admission: RE | Disposition: A | Discharge status: Home / Self Care | Payer: Self-pay | Source: Ambulatory Visit | Attending: Urology

## 2012-02-26 ENCOUNTER — Encounter (HOSPITAL_COMMUNITY): Payer: Self-pay | Admitting: Anesthesiology

## 2012-02-26 ENCOUNTER — Ambulatory Visit (HOSPITAL_COMMUNITY): Payer: Medicare PPO | Admitting: Anesthesiology

## 2012-02-26 ENCOUNTER — Inpatient Hospital Stay (HOSPITAL_COMMUNITY)
Admission: RE | Admit: 2012-02-26 | Discharge: 2012-02-28 | DRG: 658 | Disposition: A | Discharge status: Home / Self Care | Payer: Medicare PPO | Source: Ambulatory Visit | Attending: Urology | Admitting: Urology

## 2012-02-26 ENCOUNTER — Encounter (HOSPITAL_COMMUNITY): Payer: Self-pay | Admitting: *Deleted

## 2012-02-26 DIAGNOSIS — I10 Essential (primary) hypertension: Secondary | ICD-10-CM | POA: Diagnosis present

## 2012-02-26 DIAGNOSIS — Z8546 Personal history of malignant neoplasm of prostate: Secondary | ICD-10-CM

## 2012-02-26 DIAGNOSIS — Z01812 Encounter for preprocedural laboratory examination: Secondary | ICD-10-CM

## 2012-02-26 DIAGNOSIS — G4730 Sleep apnea, unspecified: Secondary | ICD-10-CM | POA: Diagnosis present

## 2012-02-26 DIAGNOSIS — C649 Malignant neoplasm of unspecified kidney, except renal pelvis: Principal | ICD-10-CM | POA: Diagnosis present

## 2012-02-26 HISTORY — PX: LAPAROSCOPIC NEPHRECTOMY: SHX1930

## 2012-02-26 LAB — GLUCOSE, CAPILLARY
Glucose-Capillary: 151 mg/dL — ABNORMAL HIGH (ref 70–99)
Glucose-Capillary: 141 mg/dL — ABNORMAL HIGH (ref 70–99)

## 2012-02-26 LAB — ABO/RH: ABO/RH(D): O POS

## 2012-02-26 LAB — TYPE AND SCREEN

## 2012-02-26 LAB — BASIC METABOLIC PANEL
BUN: 8 mg/dL (ref 6–23)
Calcium: 8.8 mg/dL (ref 8.4–10.5)
Creatinine, Ser: 0.85 mg/dL (ref 0.50–1.35)
GFR calc Af Amer: >90 mL/min (ref >90)
GFR calc non Af Amer: 84 mL/min — ABNORMAL LOW (ref >90)
Potassium: 4 mEq/L (ref 3.5–5.1)

## 2012-02-26 SURGERY — NEPHRECTOMY, RADICAL, LAPAROSCOPIC, ADULT
Anesthesia: General | Laterality: Right | Wound class: Clean

## 2012-02-26 MED ORDER — ONDANSETRON HCL 4 MG/2ML IJ SOLN: 4.0000 mg | INTRAMUSCULAR | Status: DC | PRN

## 2012-02-26 MED ORDER — BUPIVACAINE LIPOSOME 1.3 % IJ SUSP
INTRAMUSCULAR | Status: DC | PRN
  Administered 2012-02-26: 40 mL

## 2012-02-26 MED ORDER — PROPOFOL 10 MG/ML IV EMUL
INTRAVENOUS | Status: DC | PRN
  Administered 2012-02-26: 200 mg via INTRAVENOUS

## 2012-02-26 MED ORDER — DEXTROSE-NACL 5-0.45 % IV SOLN
INTRAVENOUS | Status: DC
  Administered 2012-02-26 – 2012-02-27 (×3): via INTRAVENOUS

## 2012-02-26 MED ORDER — CEFAZOLIN SODIUM-DEXTROSE 2-3 GM-% IV SOLR
INTRAVENOUS | Status: AC
  Filled 2012-02-26: qty 50

## 2012-02-26 MED ORDER — DOCUSATE SODIUM 100 MG PO CAPS
100.0000 mg | ORAL_CAPSULE | Freq: Two times a day (BID) | ORAL | Status: DC
  Administered 2012-02-26 – 2012-02-27 (×3): 100 mg via ORAL
  Filled 2012-02-26 (×5): qty 1

## 2012-02-26 MED ORDER — HYDROMORPHONE HCL PF 1 MG/ML IJ SOLN
0.5000 mg | INTRAMUSCULAR | Status: DC | PRN
  Administered 2012-02-26: 15:00:00 via INTRAVENOUS

## 2012-02-26 MED ORDER — LACTATED RINGERS IV SOLN
INTRAVENOUS | Status: DC | PRN
  Administered 2012-02-26 (×3): via INTRAVENOUS

## 2012-02-26 MED ORDER — LACTATED RINGERS IR SOLN
Status: DC | PRN
  Administered 2012-02-26: 1000 mL

## 2012-02-26 MED ORDER — ACETAMINOPHEN 10 MG/ML IV SOLN
INTRAVENOUS | Status: DC | PRN
  Administered 2012-02-26: 1000 mg via INTRAVENOUS

## 2012-02-26 MED ORDER — CISATRACURIUM BESYLATE 2 MG/ML IV SOLN
INTRAVENOUS | Status: DC | PRN
  Administered 2012-02-26: 10 mg via INTRAVENOUS

## 2012-02-26 MED ORDER — HYDROMORPHONE HCL PF 1 MG/ML IJ SOLN
INTRAMUSCULAR | Status: AC
  Filled 2012-02-26: qty 1

## 2012-02-26 MED ORDER — LIDOCAINE HCL (CARDIAC) 20 MG/ML IV SOLN
INTRAVENOUS | Status: DC | PRN
  Administered 2012-02-26: 20 mg via INTRAVENOUS

## 2012-02-26 MED ORDER — INSULIN ASPART 100 UNIT/ML ~~LOC~~ SOLN
0.0000 [IU] | SUBCUTANEOUS | Status: DC
  Administered 2012-02-26 – 2012-02-28 (×6): 2 [IU] via SUBCUTANEOUS

## 2012-02-26 MED ORDER — SUCCINYLCHOLINE CHLORIDE 20 MG/ML IJ SOLN
INTRAMUSCULAR | Status: DC | PRN
  Administered 2012-02-26: 120 mg via INTRAVENOUS

## 2012-02-26 MED ORDER — ACETAMINOPHEN 10 MG/ML IV SOLN
1000.0000 mg | Freq: Four times a day (QID) | INTRAVENOUS | Status: DC
  Administered 2012-02-26 – 2012-02-27 (×3): 1000 mg via INTRAVENOUS
  Filled 2012-02-26 (×4): qty 100

## 2012-02-26 MED ORDER — ZOLPIDEM TARTRATE 5 MG PO TABS: 5.0000 mg | ORAL_TABLET | Freq: Every evening | ORAL | Status: DC | PRN

## 2012-02-26 MED ORDER — MIDAZOLAM HCL 5 MG/5ML IJ SOLN
INTRAMUSCULAR | Status: DC | PRN
  Administered 2012-02-26: 2 mg via INTRAVENOUS

## 2012-02-26 MED ORDER — CEFAZOLIN SODIUM-DEXTROSE 2-3 GM-% IV SOLR
2.0000 g | Freq: Once | INTRAVENOUS | Status: AC
  Administered 2012-02-26: 2 g via INTRAVENOUS

## 2012-02-26 MED ORDER — CISATRACURIUM BESYLATE (PF) 10 MG/5ML IV SOLN
INTRAVENOUS | Status: DC | PRN
  Administered 2012-02-26: 2 mg via INTRAVENOUS
  Administered 2012-02-26: 10 mg via INTRAVENOUS
  Administered 2012-02-26: 2 mg via INTRAVENOUS

## 2012-02-26 MED ORDER — ACETAMINOPHEN 10 MG/ML IV SOLN
INTRAVENOUS | Status: AC
  Filled 2012-02-26: qty 100

## 2012-02-26 MED ORDER — NEOSTIGMINE METHYLSULFATE 1 MG/ML IJ SOLN
INTRAMUSCULAR | Status: DC | PRN
  Administered 2012-02-26: 2 mg via INTRAVENOUS
  Administered 2012-02-26: 3 mg via INTRAVENOUS

## 2012-02-26 MED ORDER — LACTATED RINGERS IV SOLN: INTRAVENOUS | Status: DC

## 2012-02-26 MED ORDER — GLYCOPYRROLATE 0.2 MG/ML IJ SOLN
INTRAMUSCULAR | Status: DC | PRN
  Administered 2012-02-26: 0.2 mg via INTRAVENOUS
  Administered 2012-02-26: 0.4 mg via INTRAVENOUS

## 2012-02-26 MED ORDER — HYDROMORPHONE HCL PF 1 MG/ML IJ SOLN
0.5000 mg | INTRAMUSCULAR | Status: DC | PRN
  Administered 2012-02-26 – 2012-02-27 (×5): 1 mg via INTRAVENOUS
  Filled 2012-02-26 (×5): qty 1

## 2012-02-26 MED ORDER — FENTANYL CITRATE 0.05 MG/ML IJ SOLN
INTRAMUSCULAR | Status: DC | PRN
  Administered 2012-02-26: 50 ug via INTRAVENOUS
  Administered 2012-02-26 (×2): 25 ug via INTRAVENOUS
  Administered 2012-02-26 (×3): 100 ug via INTRAVENOUS
  Administered 2012-02-26 (×2): 50 ug via INTRAVENOUS

## 2012-02-26 MED ORDER — DIPHENHYDRAMINE HCL 50 MG/ML IJ SOLN: 12.5000 mg | Freq: Four times a day (QID) | INTRAMUSCULAR | Status: DC | PRN

## 2012-02-26 MED ORDER — DIPHENHYDRAMINE HCL 12.5 MG/5ML PO ELIX: 12.5000 mg | ORAL_SOLUTION | Freq: Four times a day (QID) | ORAL | Status: DC | PRN

## 2012-02-26 MED ORDER — ONDANSETRON HCL 4 MG/2ML IJ SOLN
INTRAMUSCULAR | Status: DC | PRN
  Administered 2012-02-26: 4 mg via INTRAVENOUS

## 2012-02-26 MED ORDER — CEFAZOLIN SODIUM 1-5 GM-% IV SOLN
1.0000 g | Freq: Three times a day (TID) | INTRAVENOUS | Status: AC
  Administered 2012-02-26 – 2012-02-27 (×2): 1 g via INTRAVENOUS
  Filled 2012-02-26 (×2): qty 50

## 2012-02-26 MED ORDER — HYDROMORPHONE HCL PF 1 MG/ML IJ SOLN
0.2500 mg | INTRAMUSCULAR | Status: DC | PRN
  Administered 2012-02-26 (×5): 0.5 mg via INTRAVENOUS

## 2012-02-26 MED ORDER — BUPIVACAINE LIPOSOME 1.3 % IJ SUSP
20.0000 mL | Freq: Once | INTRAMUSCULAR | Status: DC
  Filled 2012-02-26: qty 20

## 2012-02-26 MED ORDER — LACTATED RINGERS IV SOLN
INTRAVENOUS | Status: DC
  Administered 2012-02-26: 1000 mL via INTRAVENOUS

## 2012-02-26 MED ORDER — SIMVASTATIN 20 MG PO TABS
20.0000 mg | ORAL_TABLET | Freq: Every day | ORAL | Status: DC
  Administered 2012-02-26 – 2012-02-27 (×2): 20 mg via ORAL
  Filled 2012-02-26 (×3): qty 1

## 2012-02-26 SURGICAL SUPPLY — 65 items
ADH SKN CLS APL DERMABOND .7 (GAUZE/BANDAGES/DRESSINGS)
APL SKNCLS STERI-STRIP NONHPOA (GAUZE/BANDAGES/DRESSINGS)
BAG SPEC THK2 15X12 ZIP CLS (MISCELLANEOUS) ×1
BAG ZIPLOCK 12X15 (MISCELLANEOUS) ×2 IMPLANT
BENZOIN TINCTURE PRP APPL 2/3 (GAUZE/BANDAGES/DRESSINGS) ×1 IMPLANT
BLADE EXTENDED COATED 6.5IN (ELECTRODE) IMPLANT
BLADE SURG SZ10 CARB STEEL (BLADE) ×2 IMPLANT
CANISTER SUCTION 2500CC (MISCELLANEOUS) ×1 IMPLANT
CHLORAPREP W/TINT 26ML (MISCELLANEOUS) ×2 IMPLANT
CLIP LIGATING HEM O LOK PURPLE (MISCELLANEOUS) ×2 IMPLANT
CLIP LIGATING HEMO LOK XL GOLD (MISCELLANEOUS) ×1 IMPLANT
CLIP LIGATING HEMO O LOK GREEN (MISCELLANEOUS) ×6 IMPLANT
CLOTH BEACON ORANGE TIMEOUT ST (SAFETY) ×2 IMPLANT
COVER SURGICAL LIGHT HANDLE (MISCELLANEOUS) ×2 IMPLANT
CUTTER FLEX LINEAR 45M (STAPLE) ×1 IMPLANT
DERMABOND ADVANCED (GAUZE/BANDAGES/DRESSINGS)
DERMABOND ADVANCED .7 DNX12 (GAUZE/BANDAGES/DRESSINGS) ×1 IMPLANT
DRAIN CHANNEL 10F 3/8 F FF (DRAIN) IMPLANT
DRAPE CAMERA CLOSED 9X96 (DRAPES) ×1 IMPLANT
DRAPE INCISE IOBAN 66X45 STRL (DRAPES) ×2 IMPLANT
DRAPE LAPAROSCOPIC ABDOMINAL (DRAPES) ×2 IMPLANT
DRAPE WARM FLUID 44X44 (DRAPE) ×2 IMPLANT
DRSG TEGADERM 4X4.75 (GAUZE/BANDAGES/DRESSINGS) ×1 IMPLANT
ELECT REM PT RETURN 9FT ADLT (ELECTROSURGICAL) ×2
ELECTRODE REM PT RTRN 9FT ADLT (ELECTROSURGICAL) ×1 IMPLANT
EVACUATOR SILICONE 100CC (DRAIN) IMPLANT
FLOSEAL (HEMOSTASIS) IMPLANT
FLOSEAL 10ML (HEMOSTASIS) ×1 IMPLANT
GLOVE BIOGEL M STRL SZ7.5 (GLOVE) ×3 IMPLANT
GOWN STRL NON-REIN LRG LVL3 (GOWN DISPOSABLE) ×4 IMPLANT
HEMOSTAT SURGICEL 4X8 (HEMOSTASIS) IMPLANT
KIT BASIN OR (CUSTOM PROCEDURE TRAY) ×2 IMPLANT
PENCIL BUTTON HOLSTER BLD 10FT (ELECTRODE) ×2 IMPLANT
POSITIONER SURGICAL ARM (MISCELLANEOUS) ×2 IMPLANT
POUCH ENDO CATCH II 15MM (MISCELLANEOUS) ×3 IMPLANT
RELOAD 45 VASCULAR/THIN (ENDOMECHANICALS) ×2 IMPLANT
RELOAD STAPLE 45 2.5 WHT GRN (ENDOMECHANICALS) IMPLANT
RETRACTOR LAPSCP 12X46 CVD (ENDOMECHANICALS) IMPLANT
RTRCTR LAPSCP 12X46 CVD (ENDOMECHANICALS)
SCALPEL HARMONIC ACE (MISCELLANEOUS) ×2 IMPLANT
SCISSORS LAP 5X35 DISP (ENDOMECHANICALS) IMPLANT
SET IRRIG TUBING LAPAROSCOPIC (IRRIGATION / IRRIGATOR) ×2 IMPLANT
SOLUTION ANTI FOG 6CC (MISCELLANEOUS) ×1 IMPLANT
SPONGE GAUZE 4X4 12PLY (GAUZE/BANDAGES/DRESSINGS) ×1 IMPLANT
SPONGE LAP 18X18 X RAY DECT (DISPOSABLE) ×2 IMPLANT
SPONGE LAP 4X18 X RAY DECT (DISPOSABLE) ×2 IMPLANT
SPONGE SURGIFOAM ABS GEL 100 (HEMOSTASIS) IMPLANT
STRIP CLOSURE SKIN 1/4X4 (GAUZE/BANDAGES/DRESSINGS) ×1 IMPLANT
SUT ETHILON 3 0 PS 1 (SUTURE) IMPLANT
SUT MNCRL AB 4-0 PS2 18 (SUTURE) ×4 IMPLANT
SUT PDS AB 1 CTX 36 (SUTURE) ×4 IMPLANT
SUT VIC AB 2-0 SH 27 (SUTURE)
SUT VIC AB 2-0 SH 27X BRD (SUTURE) ×2 IMPLANT
SUT VICRYL 0 UR6 27IN ABS (SUTURE) ×5 IMPLANT
TAPE STRIPS DRAPE STRL (GAUZE/BANDAGES/DRESSINGS) ×2 IMPLANT
TOWEL OR 17X26 10 PK STRL BLUE (TOWEL DISPOSABLE) ×2 IMPLANT
TOWEL OR NON WOVEN STRL DISP B (DISPOSABLE) ×2 IMPLANT
TRAY FOLEY CATH 14FRSI W/METER (CATHETERS) ×2 IMPLANT
TRAY LAP CHOLE (CUSTOM PROCEDURE TRAY) ×2 IMPLANT
TROCAR BLADELESS OPT 5 100 (ENDOMECHANICALS) ×2 IMPLANT
TROCAR BLADELESS OPT 5 75 (ENDOMECHANICALS) ×2 IMPLANT
TROCAR XCEL 12X100 BLDLESS (ENDOMECHANICALS) ×3 IMPLANT
TROCAR XCEL BLUNT TIP 100MML (ENDOMECHANICALS) ×2 IMPLANT
TUBING INSUFFLATION 10FT LAP (TUBING) ×2 IMPLANT
YANKAUER SUCT BULB TIP 10FT TU (MISCELLANEOUS) ×2 IMPLANT

## 2012-02-26 NOTE — Evaluation (Signed)
Physical Therapy Evaluation Patient Details Name: Anthony Stark MRN: [id number] DOB: June 29, 1935 Today's Date: 02/26/2012 Time: 8295-6213 PT Time Calculation (min): 34 min  PT Assessment / Plan / Recommendation Clinical Impression  77 y.o. male admitted to [facility] for laporoscopic R nephrectomy on 02/26/12.  MD wanted pt to attempt ambulation this evening, but he was only able to tolerate OOB to chair.  The patient presents with increased surgical incisional pain, decreased mobility, balance, leg strength and decreased ambulation tolerance. He would benefit from acute PT to maximize independence, functional mobility and safety so that he may return home with wife's assist at discharge.      PT Assessment  Patient needs continued PT services    Follow Up Recommendations  Home health PT;Other (comment) (vs none, depends on progress)    Barriers to Discharge  none      lEquipment Recommendations  None recommended by PT    Recommendations for Other Services   none  Frequency Min 3X/week    Precautions / Restrictions Restrictions Weight Bearing Restrictions: No   Pertinent Vitals/Pain Pain 5/10 in abdomen, repositioned      Mobility  Bed Mobility Bed Mobility: Supine to Sit;Sitting - Scoot to Edge of Bed Supine to Sit: 4: Min assist;With rails;HOB elevated Sitting - Scoot to [place] of Bed: 4: Min assist Details for Bed Mobility Assistance: min assist to support trunk due to pain in abdomen when moving in the bed.   Transfers Transfers: Sit to Stand;Stand to Sit;Squat Pivot Transfers Sit to Stand: 4: Min assist;From bed;From elevated surface;With upper extremity assist Stand to Sit: 4: Min assist;To chair/3-in-1;To elevated surface;With armrests;With upper extremity assist Squat Pivot Transfers: 4: Min assist;From elevated surface;With upper extremity assistance Details for Transfer Assistance: min hand held assist to get up, knees flexed and trunk flexed during transfer.     Ambulation/Gait Ambulation/Gait Assistance:  (not ready for gait this PM )     PT Diagnosis: Difficulty walking;Abnormality of gait;Generalized weakness;Acute pain  PT Problem List: Decreased strength;Decreased activity tolerance;Decreased balance;Decreased mobility;Decreased knowledge of use of DME;Pain PT Treatment Interventions: DME instruction;Gait training;Stair training;Functional mobility training;Therapeutic activities;Therapeutic exercise;Balance training;Neuromuscular re-education;Patient/family education   PT Goals Acute Rehab PT Goals PT Goal Formulation: With patient Time For Goal Achievement: 03/11/12 Potential to Achieve Goals: Good Pt will go Supine/Side to Sit: with modified independence;with HOB 0 degrees PT Goal: Supine/Side to Sit - Progress: Goal set today Pt will go Sit to Supine/Side: with modified independence;with HOB 0 degrees PT Goal: Sit to Supine/Side - Progress: Goal set today Pt will go Sit to Stand: with supervision PT Goal: Sit to Stand - Progress: Goal set today Pt will go Stand to Sit: with supervision PT Goal: Stand to Sit - Progress: Goal set today Pt will Transfer Bed to Chair/Chair to Bed: with supervision PT Transfer Goal: Bed to Chair/Chair to Bed - Progress: Goal set today Pt will Ambulate: 51 - 150 feet;with supervision;with least restrictive assistive device PT Goal: Ambulate - Progress: Goal set today Pt will Go Up / Down Stairs: 6-9 stairs;with min assist;with rail(s) PT Goal: Up/Down Stairs - Progress: Goal set today  Visit Information  Last PT Received On: 02/26/12 Assistance Needed: +1    Subjective Data  Subjective: Patient reports recent history of spinal stenosis ~8 week history Patient Stated Goal: to go home at discharge   Prior Functioning  Home Living Lives With: Spouse Available Help at Discharge: Family;Available 24 hours/day Type of Home: House Home Access: Stairs to enter Entrance  Stairs-Number of Steps:  8 Entrance Stairs-Rails: Right;Left Home Layout: Two level;Able to live on main level with bedroom/bathroom (hobbies are upstairs) Alternate Level Stairs-Number of Steps: 14 (crawls on hands and knees to get up stairs at baseline) Alternate Level Stairs-Rails: Can reach both;Left;Right Bathroom Shower/Tub: Walk-in shower;Curtain Bathroom Toilet: Handicapped height Home Adaptive Equipment: Straight cane;Built-in shower seat;Walker - standard Additional Comments: patient uses a cane to walk Prior Function Level of Independence: Independent Able to Take Stairs?: Yes Driving: No Vocation: Retired Musician: No difficulties Dominant Hand: Right    Cognition  Overall Cognitive Status: Appears within functional limits for tasks assessed/performed Arousal/Alertness: Lethargic Behavior During Session: Lethargic Cognition - Other Comments: slow secondary to pain meds and post anesthesia     Extremity/Trunk Assessment Right Lower Extremity Assessment RLE ROM/Strength/Tone: Deficits RLE ROM/Strength/Tone Deficits: grossly 3/5 today per functional assessment.   Left Lower Extremity Assessment LLE ROM/Strength/Tone: Deficits LLE ROM/Strength/Tone Deficits: grossly 3/5 today per gross functional assessment.    End of Session PT - End of Session Activity Tolerance: Patient limited by fatigue;Patient limited by pain Patient left: in chair;with call bell/phone within reach Nurse Communication: Mobility status (spoke with RN and Hydrologist)   Rollene Rotunda. Delora Gravatt, PT, DPT [phone] 02/26/2012, 5:32 PM

## 2012-02-26 NOTE — Transfer of Care (Signed)
Immediate Anesthesia Transfer of Care Note  Patient: Anthony Stark  Procedure(s) Performed: Procedure(s) (LRB): LAPAROSCOPIC NEPHRECTOMY (Right)  Patient Location: PACU  Anesthesia Type: General  Level of Consciousness: awake and alert   Airway & Oxygen Therapy: Patient Spontanous Breathing and Patient connected to face mask oxygen  Post-op Assessment: Report given to PACU RN and Post -op Vital signs reviewed and stable  Post vital signs: Reviewed and stable  Complications: No apparent anesthesia complications 

## 2012-02-26 NOTE — Progress Notes (Signed)
Very anxious. Gripping siderails. Arms stiff. No relief after Dilaudid 2mg . Dr. Leta Jungling paged.

## 2012-02-26 NOTE — Progress Notes (Signed)
Dr. Leta Jungling paged third time to apprise of need for additional pain meds.

## 2012-02-26 NOTE — Anesthesia Postprocedure Evaluation (Signed)
  Anesthesia Post-op Note  Patient: Anthony Stark  Procedure(s) Performed: Procedure(s) (LRB): LAPAROSCOPIC NEPHRECTOMY (Right)  Patient Location: PACU  Anesthesia Type: General  Level of Consciousness: awake and alert   Airway and Oxygen Therapy: Patient Spontanous Breathing  Post-op Pain: mild  Post-op Assessment: Post-op Vital signs reviewed, Patient's Cardiovascular Status Stable, Respiratory Function Stable, Patent Airway and No signs of Nausea or vomiting  Post-op Vital Signs: stable  Complications: No apparent anesthesia complications

## 2012-02-26 NOTE — Progress Notes (Signed)
   CARE MANAGEMENT NOTE 02/26/2012  Patient:  Anthony Stark, Anthony Stark   Account Number:  [id number]  Date Initiated:  02/26/2012  Documentation initiated by:  Jiles Crocker  Subjective/Objective Assessment:   ADMITTED FOR SURGERY - Right laparoscopic radical nephrectomy     Action/Plan:   LIVES AT HOME WITH SPOUSE; DCP - HOME WHEN MEDICALLY STABLE   Anticipated DC Date:  03/01/2012   Anticipated DC Plan:  HOME/SELF CARE             Status of service:  In process, will continue to follow Medicare Important Message given?  NA - LOS <3 / Initial given by admissions (If response is "NO", the following Medicare IM given date fields will be blank)  Per UR Regulation:  Reviewed for med. necessity/level of care/duration of stay  Comments:  02/26/2012- B Sadee Osland RN, BSN, MHA

## 2012-02-26 NOTE — Progress Notes (Signed)
Dr. Leta Jungling in to see. Additional orders given.

## 2012-02-26 NOTE — Progress Notes (Signed)
No response from Dr. Leta Jungling. Paged again.

## 2012-02-26 NOTE — Progress Notes (Signed)
Completed bowel prep as directed by doctor 

## 2012-02-26 NOTE — Interval H&P Note (Signed)
History and Physical Interval Note:  02/26/2012 10:03 AM  Anthony Stark  has presented today for surgery, with the diagnosis of RIGHT RENAL NEOPLASM  The various methods of treatment have been discussed with the patient and family. After consideration of risks, benefits and other options for treatment, the patient has consented to  Procedure(s) (LRB): LAPAROSCOPIC NEPHRECTOMY (Right) as a surgical intervention .  The patients' history has been reviewed, patient examined, no change in status, stable for surgery.  I have reviewed the patients' chart and labs.  Questions were answered to the patient's satisfaction.     Briannie Gutierrez,LES

## 2012-02-26 NOTE — Op Note (Signed)
Preoperative diagnosis: Right renal neoplasms  Postoperative diagnosis: Right renal neoplasms  Procedure: 1.  Right laparoscopic radical nephrectomy  Surgeon: Moody Bruins. M.D.  Assistant(s): Pecola Leisure, PA-C  Anesthesia: General  Complications: None  EBL: 50 mL  IVF:  2000 mL crystalloid  Specimens: 1. Right kidney  Disposition of specimens: Pathology  Indication: Anthony Stark is a 77 y.o. patient with two right renal tumors suspicious for malignancy.  After a thorough review of the management options for their renal mass, they elected to proceed with surgical treatment and the above procedure.  We have discussed the potential benefits and risks of the procedure, side effects of the proposed treatment, the likelihood of the patient achieving the goals of the procedure, and any potential problems that might occur during the procedure or recuperation. Informed consent has been obtained.  Description of procedure:  The patient was taken to the operating room and a general anesthetic was administered. The patient was given preoperative antibiotics, placed in the right modified flank position, and prepped and draped in the usual sterile fashion. Next a preoperative timeout was performed.  A site was selected near the umbilicus for placement of the camera port. This was placed using a standard open Hassan technique which allowed entry into the peritoneal cavity under direct vision and without difficulty. A 12 mm Hassan cannula was placed and a pneumoperitoneum established. The camera was then used to inspect the abdomen and there was no evidence of any intra-abdominal injuries or other abnormalities. The remaining abdominal ports were then placed. A 12 mm port was placed in the right lower quadrant and a 5 mm port was placed in the right upper quadrant.  All ports were placed under direct vision without difficulty.  Utilizing the harmonic scalpel, the white line of Toldt  was incised allowing the colon to be mobilized medially and the plane between the mesocolon and the anterior layer of Gerota's fascia to be developed and the kidney exposed.  The ureter and gonadal vein were identified inferiorly and the ureter was lifted anteriorly off the psoas muscle.  Dissection proceeded superiorly along the gonadal vein until the renal vein was identified.  The renal hilum was then carefully isolated with a combination of blunt and sharp dissectiong allowing the renal arterial and venous structures to be separated and isolated.  There were two branching renal arteries and a main single renal vein with small tributary veins adjacent to the renal vein.  The renal arteries were isolated and ligated with multiple Weck clips and subsequently divided.  The renal vein was then isolated and also ligated and divided with a 45 mm Flex ETS stapler.  Gerota's fascia was intentionally entered superiorly and the space between the adrenal gland and the kidney was developed allowing the adrenal gland to be spared.  The hepatorenal ligaments were divided with the harmonic scalpel.  The lateral and posterior attachements to the kidney were then divided.  The ureter was ligated with Weck clips and divided allowing the specimen to be freed from all surrounding structures.  The kidney specimen was then placed into a 15 mm Endocatch II retrieval bag.  The renal hilum, liver, adrenal bed and gonadal vein areas were each inspected and hemostasis was ensured with the pneomperitoneal pressures lowered.  The 12 mm lower quadrant port was then closed with a 0-vicryl suture placed laparoscopically to close the fascia of this incision. All remaining ports were removed under direct vision.  The kidney specimen was removed  intact within the retrieval bag via the camera port site after this incision was extended slightly. This fascial opening was then closed with two #1 PDS sutures.    All incisions were injected with  local anesthetic and reapproximated at the skin with 4-0 monocryl sutures.  Sterile dressings were then placed over the incisions. The patient tolerated the procedure well and without complications and was transferred to the recovery unit in satisfactory condition.   Moody Bruins MD

## 2012-02-26 NOTE — Transfer of Care (Signed)
Immediate Anesthesia Transfer of Care Note  Patient: Anthony Stark  Procedure(s) Performed: Procedure(s) (LRB): LAPAROSCOPIC NEPHRECTOMY (Right)  Patient Location: PACU  Anesthesia Type: General  Level of Consciousness: awake and alert   Airway & Oxygen Therapy: Patient Spontanous Breathing and Patient connected to face mask oxygen  Post-op Assessment: Report given to PACU RN and Post -op Vital signs reviewed and stable  Post vital signs: Reviewed and stable  Complications: No apparent anesthesia complications

## 2012-02-26 NOTE — Anesthesia Preprocedure Evaluation (Addendum)
Anesthesia Evaluation  Patient identified by MRN, date of birth, ID band Patient awake    Reviewed: Allergy & Precautions, H&P , NPO status , Patient's Chart, lab work & pertinent test results  History of Anesthesia Complications (+) AWARENESS UNDER ANESTHESIA  Airway Mallampati: III TM Distance: >3 FB Neck ROM: full    Dental No notable dental hx. (+) Teeth Intact and Dental Advisory Given   Pulmonary sleep apnea ,  breath sounds clear to auscultation  Pulmonary exam normal       Cardiovascular Exercise Tolerance: Good hypertension, Pt. on medications Rhythm:regular Rate:Normal     Neuro/Psych Spinal stenosis. negative neurological ROS  negative psych ROS   GI/Hepatic negative GI ROS, Neg liver ROS,   Endo/Other  negative endocrine ROS  Renal/GU negative Renal ROS  negative genitourinary   Musculoskeletal   Abdominal   Peds  Hematology negative hematology ROS (+)   Anesthesia Other Findings   Reproductive/Obstetrics negative OB ROS                          Anesthesia Physical Anesthesia Plan  ASA: III  Anesthesia Plan: General   Post-op Pain Management:    Induction: Intravenous  Airway Management Planned: Oral ETT  Additional Equipment:   Intra-op Plan:   Post-operative Plan: Extubation in OR  Informed Consent: I have reviewed the patients History and Physical, chart, labs and discussed the procedure including the risks, benefits and alternatives for the proposed anesthesia with the patient or authorized representative who has indicated his/her understanding and acceptance.   Dental Advisory Given  Plan Discussed with: CRNA and Surgeon  Anesthesia Plan Comments:         Anesthesia Quick Evaluation

## 2012-02-26 NOTE — Discharge Instructions (Signed)

## 2012-02-26 NOTE — Telephone Encounter (Addendum)
Message copied by Mirian Capuchin on Mon Feb 26, 2012 11:44 AM ------      Message from: Arlan Organ R      Created: Mon Feb 19, 2012  9:33 PM       Call - no evidence of spread to lungs!  Good luck with surgery!!  Pete Left message on pt's home answering machine.

## 2012-02-27 LAB — GLUCOSE, CAPILLARY
Glucose-Capillary: 115 mg/dL — ABNORMAL HIGH (ref 70–99)
Glucose-Capillary: 126 mg/dL — ABNORMAL HIGH (ref 70–99)
Glucose-Capillary: 95 mg/dL (ref 70–99)

## 2012-02-27 LAB — BASIC METABOLIC PANEL
BUN: 7 mg/dL (ref 6–23)
CO2: 28 mEq/L (ref 19–32)
Chloride: 98 mEq/L (ref 96–112)
GFR calc non Af Amer: 80 mL/min — ABNORMAL LOW (ref >90)
Glucose, Bld: 103 mg/dL — ABNORMAL HIGH (ref 70–99)
Potassium: 3.3 mEq/L — ABNORMAL LOW (ref 3.5–5.1)
Sodium: 133 mEq/L — ABNORMAL LOW (ref 135–145)

## 2012-02-27 LAB — HEMOGLOBIN AND HEMATOCRIT, BLOOD: HCT: 39.4 % (ref 39.0–52.0)

## 2012-02-27 MED ORDER — HYDROCODONE-ACETAMINOPHEN 5-325 MG PO TABS
1.0000 | ORAL_TABLET | Freq: Four times a day (QID) | ORAL | Status: DC | PRN
  Administered 2012-02-27 (×2): 2 via ORAL
  Filled 2012-02-27 (×2): qty 2

## 2012-02-27 MED ORDER — KCL IN DEXTROSE-NACL 20-5-0.45 MEQ/L-%-% IV SOLN
INTRAVENOUS | Status: DC
  Administered 2012-02-27: 10:00:00 via INTRAVENOUS
  Filled 2012-02-27 (×2): qty 1000

## 2012-02-27 MED ORDER — OXYCODONE-ACETAMINOPHEN 5-325 MG PO TABS
1.0000 | ORAL_TABLET | Freq: Four times a day (QID) | ORAL | Status: DC | PRN
  Administered 2012-02-27: 2 via ORAL
  Filled 2012-02-27: qty 2

## 2012-02-27 MED ORDER — BISACODYL 10 MG RE SUPP
10.0000 mg | Freq: Once | RECTAL | Status: DC
  Filled 2012-02-27: qty 1

## 2012-02-27 MED FILL — Cisatracurium Besylate (PF) IV Soln 10 MG/5ML (2 MG/ML): INTRAVENOUS | Qty: 5 | Status: AC

## 2012-02-27 NOTE — Progress Notes (Signed)
Physical Therapy Treatment Patient Details Name: Anthony Stark MRN: [id number] DOB: 05/27/1935 Today's Date: 02/27/2012 Time: 4098-1191 PT Time Calculation (min): 8 min  PT Assessment / Plan / Recommendation Comments on Treatment Session  Much more alert this session. Performed well with ambulation. No physical assist need. Pt states his wife will be able to assist as needed at home. Plans to stay on 1st level of home after discharge. Possible d/c home tomorrow.     Follow Up Recommendations  No PT follow up    Barriers to Discharge        Equipment Recommendations  None recommended by PT    Recommendations for Other Services    Frequency Min 3X/week   Plan Discharge plan remains appropriate    Precautions / Restrictions Precautions Precautions: None Restrictions Weight Bearing Restrictions: No   Pertinent Vitals/Pain     Mobility  Bed Mobility Bed Mobility: Not assessed (pt sitting eob) Transfers Transfers: Sit to Stand;Stand to Sit Sit to Stand: 5: Supervision Stand to Sit: 5: Supervision Details for Transfer Assistance: Pt pulls up on walker and maintains significantly flexed posture in standing Ambulation/Gait Ambulation/Gait Assistance: 5: Supervision Ambulation Distance (Feet): 65 Feet Assistive device: Rolling walker Ambulation/Gait Assistance Details: slow and steady with RW. Limited distance at request of patient. Maintains significantly flexed posture. Gait Pattern: Step-through pattern;Trunk flexed    Exercises     PT Diagnosis:    PT Problem List:   PT Treatment Interventions:     PT Goals Acute Rehab PT Goals PT Goal: Sit to Stand - Progress: Progressing toward goal PT Goal: Stand to Sit - Progress: Progressing toward goal PT Goal: Ambulate - Progress: Progressing toward goal  Visit Information  Last PT Received On: 02/27/12 Assistance Needed: +1    Subjective Data  Subjective: "I've been trying to produce some urine for 30 minutes  now" Patient Stated Goal: Home   Cognition  Overall Cognitive Status: Appears within functional limits for tasks assessed/performed Arousal/Alertness: Awake/alert Orientation Level: Appears intact for tasks assessed Behavior During Session: [facility] for tasks performed    Balance     End of Session PT - End of Session Activity Tolerance: Patient limited by pain Patient left: in bed;with call bell/phone within reach (sitting EOB)    Anthony Stark 02/27/2012, 1:35 PM [phone]

## 2012-02-27 NOTE — Progress Notes (Signed)
Patient ID: Anthony Stark, male   DOB: 1935/05/12, 77 y.o.   MRN: [id number]  1 Day Post-Op Subjective: The patient is doing well.  No nausea or vomiting. Pain is adequately controlled. Glucose monitored overnight and well controlled.  He did work with PT some yesterday and was able to sit in chair.  His chronic back and leg pain is also controlled with pain meds.  Objective: Vital signs in last 24 hours: Temp:  [97.4 F (36.3 C)-98.4 F (36.9 C)] 97.7 F (36.5 C) (05/13 2053) Pulse Rate:  [66-84] 78  (05/13 2053) Resp:  [10-18] 18  (05/13 2053) BP: (149-178)/(77-115) 164/83 mmHg (05/13 2053) SpO2:  [94 %-100 %] 100 % (05/13 2053) Weight:  [81.194 kg (179 lb)] 81.194 kg (179 lb) (05/13 1545)  Intake/Output from previous day: 05/13 0701 - 05/14 0700 In: 2750 [I.V.:2500; IV Piggyback:250] Out: 1225 [Urine:1175; Blood:50] Intake/Output this shift:    Physical Exam:  General: Alert and oriented. CV: RRR Lungs: Clear bilaterally. GI: Soft, Nondistended. Incisions: Clean and dry. Urine: Clear Extremities: Nontender, no erythema, no edema.  Lab Results:  Basename 02/27/12 0457 02/26/12 1418  HGB 13.3 14.8  HCT 39.4 43.6          Basename 02/27/12 0457 02/26/12 1418 02/23/12 1500  CREATININE 0.96 0.85 0.58           Results for orders placed during the hospital encounter of 02/26/12 (from the past 24 hour(s))  TYPE AND SCREEN     Status: Normal   Collection Time   02/26/12  8:55 AM      Component Value Range   ABO/RH(D) O POS     Antibody Screen NEG     Sample Expiration 02/29/2012    ABO/RH     Status: Normal   Collection Time   02/26/12  8:55 AM      Component Value Range   ABO/RH(D) O POS    GLUCOSE, CAPILLARY     Status: Abnormal   Collection Time   02/26/12  1:00 PM      Component Value Range   Glucose-Capillary 151 (*) 70 - 99 (mg/dL)  BASIC METABOLIC PANEL     Status: Abnormal   Collection Time   02/26/12  2:18 PM      Component Value Range   Sodium 137   135 - 145 (mEq/L)   Potassium 4.0  3.5 - 5.1 (mEq/L)   Chloride 101  96 - 112 (mEq/L)   CO2 29  19 - 32 (mEq/L)   Glucose, Bld 137 (*) 70 - 99 (mg/dL)   BUN 8  6 - 23 (mg/dL)   Creatinine, Ser 7.82  0.50 - 1.35 (mg/dL)   Calcium 8.8  8.4 - 95.6 (mg/dL)   GFR calc non Af Amer 84 (*) >90 (mL/min)   GFR calc Af Amer >90  >90 (mL/min)  HEMOGLOBIN AND HEMATOCRIT, BLOOD     Status: Normal   Collection Time   02/26/12  2:18 PM      Component Value Range   Hemoglobin 14.8  13.0 - 17.0 (g/dL)   HCT 21.3  08.6 - 57.8 (%)  GLUCOSE, CAPILLARY     Status: Abnormal   Collection Time   02/26/12  4:35 PM      Component Value Range   Glucose-Capillary 113 (*) 70 - 99 (mg/dL)  GLUCOSE, CAPILLARY     Status: Abnormal   Collection Time   02/26/12  8:52 PM      Component Value  Range   Glucose-Capillary 141 (*) 70 - 99 (mg/dL)  GLUCOSE, CAPILLARY     Status: Abnormal   Collection Time   02/27/12 12:21 AM      Component Value Range   Glucose-Capillary 125 (*) 70 - 99 (mg/dL)  GLUCOSE, CAPILLARY     Status: Normal   Collection Time   02/27/12  4:14 AM      Component Value Range   Glucose-Capillary 95  70 - 99 (mg/dL)  BASIC METABOLIC PANEL     Status: Abnormal   Collection Time   02/27/12  4:57 AM      Component Value Range   Sodium 133 (*) 135 - 145 (mEq/L)   Potassium 3.3 (*) 3.5 - 5.1 (mEq/L)   Chloride 98  96 - 112 (mEq/L)   CO2 28  19 - 32 (mEq/L)   Glucose, Bld 103 (*) 70 - 99 (mg/dL)   BUN 7  6 - 23 (mg/dL)   Creatinine, Ser 9.60  0.50 - 1.35 (mg/dL)   Calcium 8.1 (*) 8.4 - 10.5 (mg/dL)   GFR calc non Af Amer 80 (*) >90 (mL/min)   GFR calc Af Amer >90  >90 (mL/min)  HEMOGLOBIN AND HEMATOCRIT, BLOOD     Status: Normal   Collection Time   02/27/12  4:57 AM      Component Value Range   Hemoglobin 13.3  13.0 - 17.0 (g/dL)   HCT 45.4  09.8 - 11.9 (%)    Assessment/Plan: POD# 1 s/p laparoscopic nephrectomy.  1) Ambulate / PT (if patient unable to ambulate well due to back pain - will  consider pharmacologic prophylaxis upon discharge), Incentive spirometry 2) Advance diet as tolerated 3) Transition to oral pain medication 4) Dulcolax suppository 5) D/C urethral catheter   Moody Bruins. MD   LOS: 1 day   Antwian Santaana,LES 02/27/2012, 7:08 AM

## 2012-02-28 ENCOUNTER — Encounter (HOSPITAL_COMMUNITY): Payer: Self-pay | Admitting: Urology

## 2012-02-28 LAB — HEMOGLOBIN AND HEMATOCRIT, BLOOD: Hemoglobin: 13.7 g/dL (ref 13.0–17.0)

## 2012-02-28 LAB — BASIC METABOLIC PANEL
Chloride: 100 mEq/L (ref 96–112)
Creatinine, Ser: 1.06 mg/dL (ref 0.50–1.35)
GFR calc Af Amer: 78 mL/min — ABNORMAL LOW (ref >90)
Potassium: 4 mEq/L (ref 3.5–5.1)
Sodium: 136 mEq/L (ref 135–145)

## 2012-02-28 LAB — GLUCOSE, CAPILLARY

## 2012-02-28 MED ORDER — OXYCODONE-ACETAMINOPHEN 5-325 MG PO TABS: 1.0000 | ORAL_TABLET | Freq: Four times a day (QID) | ORAL | Status: AC | PRN

## 2012-02-28 MED ORDER — BISACODYL 10 MG RE SUPP
10.0000 mg | Freq: Once | RECTAL | Status: AC
  Administered 2012-02-28: 10 mg via RECTAL
  Filled 2012-02-28: qty 1

## 2012-02-28 NOTE — Progress Notes (Signed)
  Patient ID: Anthony Stark, male   DOB: 05-Mar-1935, 77 y.o.   MRN: [id number]  2 Days Post-Op Subjective: Doing well.  Still awaiting flatus. Ambulating well with walker (due to chronic back issues). No nausea or vomiting.  Objective: Vital signs in last 24 hours: Temp:  [97.6 F (36.4 C)-98.6 F (37 C)] 98.6 F (37 C) (05/14 2100) Pulse Rate:  [85-100] 100  (05/14 2100) Resp:  [16-18] 16  (05/14 2100) BP: (147-152)/(76-81) 147/76 mmHg (05/14 2100) SpO2:  [94 %-96 %] 94 % (05/14 2100)  Intake/Output from previous day: 05/14 0701 - 05/15 0700 In: -  Out: 775 [Urine:775] Intake/Output this shift:    Physical Exam:  General: Alert and oriented CV: RRR Lungs: Clear Abdomen: Soft, ND Incisions: C/D/I Ext: NT, No erythema  Lab Results:  Basename 02/28/12 0548 02/27/12 0457 02/26/12 1418  HGB 13.7 13.3 14.8  HCT 40.7 39.4 43.6   BMET  Basename 02/27/12 0457 02/26/12 1418  NA 133* 137  K 3.3* 4.0  CL 98 101  CO2 28 29  GLUCOSE 103* 137*  BUN 7 8  CREATININE 0.96 0.85  CALCIUM 8.1* 8.8     Studies/Results: No results found.  Path pending.  Assessment/Plan:  -- Await return of labs -- If tolerates diet this am,  Will d/c home.    LOS: 2 days   Khairi Garman,LES 02/28/2012, 6:38 AM

## 2012-02-28 NOTE — Discharge Summary (Signed)
Physician Discharge Summary  Patient ID: KENTRELL HALLAHAN MRN: [id number] DOB/AGE: [birth year] 77 y.o.  Admit date: 02/26/2012 Discharge date: 02/28/2012  Admission Diagnoses: Right renal masses  Discharge Diagnoses: Right renal masses   Discharged Condition: stable  Hospital Course: Mr. Matusek was admitted to the hospital on 02/26/12. He underwent a right laparoscopic radical nephrectomy. He tolerated the procedure well without complications. Postoperatively, he was transferred to a regular hospital room. Despite his chronic and severe back issues, he was able to ambulate with relative ease with the aid of a walker. His diet was gradually advanced until he was tolerating a regular diet. In addition, his pain regimen was changed to Percocet which improved his pain control of his back and leg compared to hydrocodone. By postoperative day 2, he had met all discharge criteria. His pathology is pending at the time of discharge.  Consults: None  Disposition: Home  Discharge Orders    Future Appointments: Provider: Department: Dept Phone: Center:   03/28/2012 10:45 AM Rachael Fee [place] [phone] None   03/28/2012 11:00 AM Josph Macho, MD [facility] [phone] None     Medication List  As of 02/28/2012  7:02 AM   STOP taking these medications         diphenhydramine-acetaminophen 25-500 MG Tabs      HYDROcodone-acetaminophen 7.5-325 MG per tablet         TAKE these medications         oxyCODONE-acetaminophen 5-325 MG per tablet   Commonly known as: PERCOCET   Take 1-2 tablets by mouth every 6 (six) hours as needed.      quinapril-hydrochlorothiazide 10-12.5 MG per tablet   Commonly known as: ACCURETIC   Take 1 tablet by mouth daily. Pt takes with evening meal      simvastatin 20 MG tablet   Commonly known as: ZOCOR   Take 1 tablet (20 mg total) by mouth at bedtime.           Follow-up Information    Follow up with Crecencio Mc, MD on 03/19/2012. (at 10:45)      Contact information:   [address] [place], [address] [facility] [place] [address] [phone]          Signed: Crecencio Mc 02/28/2012, 7:02 AM

## 2012-03-25 ENCOUNTER — Telehealth: Payer: Self-pay | Admitting: Hematology & Oncology

## 2012-03-25 NOTE — Telephone Encounter (Signed)
Pt called canceled 6-13 said Dr. Lilli Light told him he didn't need to come. Dr. Myna Hidalgo aware

## 2012-03-25 NOTE — Telephone Encounter (Signed)
Pt aware Dr. Myna Hidalgo wants to see him 6-13

## 2012-03-28 ENCOUNTER — Other Ambulatory Visit: Payer: Medicare PPO | Admitting: Lab

## 2012-03-28 ENCOUNTER — Ambulatory Visit: Payer: Medicare PPO | Admitting: Hematology & Oncology

## 2012-03-28 ENCOUNTER — Encounter: Payer: Self-pay | Admitting: Hematology & Oncology

## 2012-03-28 ENCOUNTER — Other Ambulatory Visit (HOSPITAL_BASED_OUTPATIENT_CLINIC_OR_DEPARTMENT_OTHER): Payer: Medicare PPO | Admitting: Lab

## 2012-03-28 ENCOUNTER — Ambulatory Visit (HOSPITAL_BASED_OUTPATIENT_CLINIC_OR_DEPARTMENT_OTHER): Payer: Medicare PPO | Admitting: Hematology & Oncology

## 2012-03-28 VITALS — BP 131/77 | HR 70 | Temp 97.3°F | Ht 70.0 in | Wt 183.0 lb

## 2012-03-28 DIAGNOSIS — C649 Malignant neoplasm of unspecified kidney, except renal pelvis: Secondary | ICD-10-CM

## 2012-03-28 DIAGNOSIS — Z8546 Personal history of malignant neoplasm of prostate: Secondary | ICD-10-CM

## 2012-03-28 HISTORY — DX: Malignant neoplasm of unspecified kidney, except renal pelvis: C64.9

## 2012-03-28 LAB — CBC WITH DIFFERENTIAL (CANCER CENTER ONLY)
Eosinophils Absolute: 0.3 10*3/uL (ref 0.0–0.5)
LYMPH%: 24.8 % (ref 14.0–48.0)
MCV: 92 fL (ref 82–98)
MONO#: 0.9 10*3/uL (ref 0.1–0.9)
NEUT#: 3.5 10*3/uL (ref 1.5–6.5)
Platelets: 187 10*3/uL (ref 145–400)
RBC: 4.39 10*6/uL (ref 4.20–5.70)
WBC: 6.3 10*3/uL (ref 4.0–10.0)

## 2012-03-28 LAB — COMPREHENSIVE METABOLIC PANEL
Albumin: 4.5 g/dL (ref 3.5–5.2)
CO2: 27 mEq/L (ref 19–32)
Calcium: 9.6 mg/dL (ref 8.4–10.5)
Chloride: 103 mEq/L (ref 96–112)
Glucose, Bld: 82 mg/dL (ref 70–99)
Potassium: 4.2 mEq/L (ref 3.5–5.3)
Sodium: 139 mEq/L (ref 135–145)
Total Protein: 6.7 g/dL (ref 6.0–8.3)

## 2012-03-28 LAB — LACTATE DEHYDROGENASE: LDH: 113 U/L (ref 94–250)

## 2012-03-28 NOTE — Progress Notes (Signed)
This office note has been dictated.

## 2012-03-29 NOTE — Progress Notes (Signed)
CC:   Anthony Stark, M.D. Anthony Purpura, MD Anthony Bane, MD  DIAGNOSES: 1. 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 2. History of early stage prostate cancer.  CURRENT THERAPY:  Patient status post right laparoscopic nephrectomy.  INTERIM HISTORY:  Anthony Stark comes in for his 2nd office visit.  I initially saw back in early May.  At that point in time, he was just diagnosed with probable renal cell carcinoma.  We did go ahead and do the staging studies on him.  I got a CT scan of the chest.  This was negative for any evidence of metastatic disease. He was seen by Dr. Laverle Patter of urology.  Dr. Laverle Patter went ahead and did a laparoscopic nephrectomy of the right kidney on May 13th.  Dr. Laverle Patter did a fantastic job with the procedure.  The pathology report ([id number]) showed 2 foci of clear cell carcinoma.  The largest was in 4.5 cm. The 2nd was 3 cm.  All margins were negative.  There was no evidence of renal vein invasion.  There was no evidence of angiolymphatic invasion. As such, he is stage I, (T1b N0 M0)  renal cell carcinoma.  His next surgery is going to be in a week in [place].  He has severe spinal stenosis.  He will see Dr. Alvester Morin in [place] for the procedure.  Hopefully, the next time I see Anthony Stark, he will be walking.  His recovery from his nephrectomy has been unremarkable.  He is eating well.  He has had no unusual pain.  He has had no problems with bowels or bladder.  There is no cough or shortness of breath.  He has had no fevers, sweats or chills.  He has had no leg swelling.  PHYSICAL EXAMINATION:  This is a well-developed, well-nourished white gentleman in no obvious distress.  Vital signs:  Temperature of 97.3, pulse 70, respiratory rate 18, blood pressure 131/77.  Weight is 183. Head and neck:  Normocephalic, atraumatic skull.  There are no ocular or oral lesions.  There are no palpable cervical or supraclavicular  lymph nodes.  Lungs:  Clear bilaterally.  Cardiac:  Regular rhythm with a normal S1 and S2.  There are no murmurs, rubs or bruits.  Abdomen: Soft with good bowel sounds.  He has healing laparoscopy scars.  There is no fluid wave.  There is no guarding or rebound tenderness.  There is no palpable hepatosplenomegaly. Back:  No tenderness over the spine, ribs, or hips.  Extremities:  No  lubbing, cyanosis or edema.  Neurologic:  No focal neurological deficits.  LABORATORY STUDIES:  White cell count is 6.3, hemoglobin 13.7, hematocrit 40.3, platelet count 187.  IMPRESSION:  Anthony Stark is a 77 year old gentleman.  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 As always, we had a good time talking about our alma mater.  I see absolutely no indication for any adjuvant intervention for the right renal cell carcinoma.  Given that this is stage I, his overall prognosis should be quite favorable. One would have to think that his risk of recurrence should be less than 15%.  I do not see any indication for doing routine scans.  I think the yield would be very low with respect to recurrence.  I would like to see Anthony Stark back in about 3 months.  We will get routine lab work on him.  I told Anthony Stark and his wife that I would probably not  order any routine scans unless he has symptoms or if we see abnormalities with lab work.  I just wan to see Anthony Stark walk back in, which hopefully will be able to do, once he gets his spinal stenosis taken care of.    ______________________________ Josph Macho, M.D. PRE/MEDQ  D:  03/28/2012  T:  03/29/2012  Job:  [id number]

## 2012-04-15 DIAGNOSIS — M48061 Spinal stenosis, lumbar region without neurogenic claudication: Secondary | ICD-10-CM

## 2012-04-15 HISTORY — DX: Spinal stenosis, lumbar region without neurogenic claudication: M48.061

## 2012-05-13 ENCOUNTER — Other Ambulatory Visit: Payer: Self-pay | Admitting: Family Medicine

## 2012-06-20 ENCOUNTER — Other Ambulatory Visit (HOSPITAL_BASED_OUTPATIENT_CLINIC_OR_DEPARTMENT_OTHER): Payer: Medicare PPO | Admitting: Lab

## 2012-06-20 ENCOUNTER — Ambulatory Visit (HOSPITAL_BASED_OUTPATIENT_CLINIC_OR_DEPARTMENT_OTHER): Payer: Medicare PPO | Admitting: Hematology & Oncology

## 2012-06-20 VITALS — BP 132/81 | HR 69 | Temp 97.4°F | Resp 20 | Ht 70.0 in | Wt 186.0 lb

## 2012-06-20 DIAGNOSIS — C649 Malignant neoplasm of unspecified kidney, except renal pelvis: Secondary | ICD-10-CM

## 2012-06-20 LAB — CBC WITH DIFFERENTIAL (CANCER CENTER ONLY)
BASO%: 0.5 % (ref 0.0–2.0)
LYMPH#: 1.6 10*3/uL (ref 0.9–3.3)
LYMPH%: 27.6 % (ref 14.0–48.0)
MONO#: 0.8 10*3/uL (ref 0.1–0.9)
Platelets: 203 10*3/uL (ref 145–400)
RDW: 13.1 % (ref 11.1–15.7)
WBC: 5.7 10*3/uL (ref 4.0–10.0)

## 2012-06-20 LAB — COMPREHENSIVE METABOLIC PANEL
ALT: 13 U/L (ref 0–53)
AST: 15 U/L (ref 0–37)
CO2: 31 mEq/L (ref 19–32)
Chloride: 101 mEq/L (ref 96–112)
Sodium: 140 mEq/L (ref 135–145)
Total Bilirubin: 0.5 mg/dL (ref 0.3–1.2)
Total Protein: 7.2 g/dL (ref 6.0–8.3)

## 2012-06-20 LAB — LACTATE DEHYDROGENASE: LDH: 117 U/L (ref 94–250)

## 2012-06-22 NOTE — Progress Notes (Signed)
This office note has been dictated.

## 2012-06-24 ENCOUNTER — Telehealth: Payer: Self-pay | Admitting: *Deleted

## 2012-06-24 NOTE — Progress Notes (Signed)
CC:   Nani Gasser, M.D. Heloise Purpura, MD Kendell Bane, MD  DIAGNOSES: 1. 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 2. History of early stage prostate cancer.  CURRENT THERAPY:  Observation.  INTERIM HISTORY:  Mr. Hence comes in for followup.  He finally had his lower back operated on.  He comes walking in finely.  He feels a whole lot better.  He was seen by Dr. Alvester Morin in [facility].  Dr. Alvester Morin basically did an outpatient procedure on him.  Again, Mr. Mika does not have the pain that he used to have.  He is able to walk now.  It is just nice that he is feeling better.  All along, his back was the biggest thing that was bothering him clinically.  The kidney cancer really was found incidentally.  He did undergo his nephrectomy back in May.  He has done well with this. He has had no problems with respect to obvious recurrence.  There is no cough.  There is no bony pain.  He has had no leg swelling.  He has not noticed any kind of rash.  There is no headache.  PHYSICAL EXAMINATION:  General:  This is a well-developed, well- nourished white gentleman in no obvious distress.  Vital signs: Temperature of 97.4, pulse 69, respiratory rate 18, blood pressure 132/81.  Weight is 186.  Head and neck:  Normocephalic, atraumatic skull.  There are no ocular or oral lesions.  There are no palpable cervical or supraclavicular lymph nodes.  Lungs:  Clear bilaterally. Cardiac:  Regular rate and rhythm with a normal S1, S2.  There are no murmurs, rubs or bruits.  Abdomen:  Soft.  He has good bowel sounds.  He has laparoscopy scars from his nephrectomy.  There is no fluid wave. There is no palpable hepatosplenomegaly.  Back:  No tenderness of the spine, ribs or hips.  He has a laminectomy scar that is healing. Extremities:  Shows no clubbing, cyanosis or edema.  Neurologic: No focal neurological deficits.  LABORATORY STUDIES:  White cell count is 5.7, hemoglobin  14.6, hematocrit 42.3, platelet count 203.  BUN 14, creatinine 1.1.  Calcium 10 with an albumin of 5.  IMPRESSION:  Mr. Smethurst is a 77 year old gentleman with stage I clear cell carcinoma of the right kidney.  He had 2 separate lesions within the kidney.  Both were less than 5 cm.  All margins were negative. There was no angiolymphatic invasion.  I think that we probably do need to follow him every 6 months with scans.  I will do a CT of the abdomen and pelvis and a chest x-ray.  I think these will be reasonable with respect to followup.  Again, I am just thankful that he can now walk and not suffer.  His back was really the biggest problem that he had clinically.  We will plan for scans to be done in December.  I will plan to see him back after his scans are done.  I will also get a chest x-ray at the same time.    ______________________________ Josph Macho, M.D. PRE/MEDQ  D:  06/22/2012  T:  06/22/2012  Job:  [id number]

## 2012-06-24 NOTE — Telephone Encounter (Signed)
Called patient to let him know that his labwork looked good.

## 2012-06-24 NOTE — Telephone Encounter (Signed)
Message copied by Anselm Jungling on Mon Jun 24, 2012  9:40 AM ------      Message from: Josph Macho      Created: Sun Jun 23, 2012  8:01 PM       Call - labs are ok!!!  Cindee Lame

## 2012-07-16 ENCOUNTER — Telehealth: Payer: Self-pay | Admitting: Hematology & Oncology

## 2012-07-16 NOTE — Telephone Encounter (Signed)
Pt aware of 12-4 CT,Lab to drink at 9 and 10 am and to be NPO 4 hours. He is also aware of 12-11 MD appointment

## 2012-07-18 ENCOUNTER — Other Ambulatory Visit: Payer: Self-pay | Admitting: Family Medicine

## 2012-07-22 ENCOUNTER — Ambulatory Visit (HOSPITAL_COMMUNITY)
Admission: RE | Admit: 2012-07-22 | Discharge: 2012-07-22 | Disposition: A | Discharge status: Home / Self Care | Payer: Medicare PPO | Source: Ambulatory Visit | Attending: Urology | Admitting: Urology

## 2012-07-22 ENCOUNTER — Other Ambulatory Visit (HOSPITAL_COMMUNITY): Payer: Self-pay | Admitting: Urology

## 2012-07-22 DIAGNOSIS — C649 Malignant neoplasm of unspecified kidney, except renal pelvis: Secondary | ICD-10-CM | POA: Insufficient documentation

## 2012-09-18 ENCOUNTER — Ambulatory Visit (HOSPITAL_BASED_OUTPATIENT_CLINIC_OR_DEPARTMENT_OTHER)
Admission: RE | Admit: 2012-09-18 | Discharge: 2012-09-18 | Disposition: A | Discharge status: Home / Self Care | Payer: Medicare PPO | Source: Ambulatory Visit | Attending: Hematology & Oncology | Admitting: Hematology & Oncology

## 2012-09-18 ENCOUNTER — Other Ambulatory Visit (HOSPITAL_BASED_OUTPATIENT_CLINIC_OR_DEPARTMENT_OTHER): Payer: Medicare PPO | Admitting: Lab

## 2012-09-18 DIAGNOSIS — Q619 Cystic kidney disease, unspecified: Secondary | ICD-10-CM | POA: Insufficient documentation

## 2012-09-18 DIAGNOSIS — Z923 Personal history of irradiation: Secondary | ICD-10-CM | POA: Insufficient documentation

## 2012-09-18 DIAGNOSIS — C649 Malignant neoplasm of unspecified kidney, except renal pelvis: Secondary | ICD-10-CM | POA: Insufficient documentation

## 2012-09-18 DIAGNOSIS — Z905 Acquired absence of kidney: Secondary | ICD-10-CM | POA: Insufficient documentation

## 2012-09-18 DIAGNOSIS — Z8546 Personal history of malignant neoplasm of prostate: Secondary | ICD-10-CM | POA: Insufficient documentation

## 2012-09-18 DIAGNOSIS — Z85528 Personal history of other malignant neoplasm of kidney: Secondary | ICD-10-CM | POA: Insufficient documentation

## 2012-09-18 LAB — CBC WITH DIFFERENTIAL (CANCER CENTER ONLY)
BASO#: 0 10*3/uL (ref 0.0–0.2)
Eosinophils Absolute: 0.2 10*3/uL (ref 0.0–0.5)
HCT: 41.1 % (ref 38.7–49.9)
LYMPH%: 25.5 % (ref 14.0–48.0)
MCH: 31.2 pg (ref 28.0–33.4)
MCV: 93 fL (ref 82–98)
MONO#: 0.7 10*3/uL (ref 0.1–0.9)
MONO%: 13.6 % — ABNORMAL HIGH (ref 0.0–13.0)
NEUT%: 57.2 % (ref 40.0–80.0)
Platelets: 174 10*3/uL (ref 145–400)
RBC: 4.42 10*6/uL (ref 4.20–5.70)
WBC: 5.2 10*3/uL (ref 4.0–10.0)

## 2012-09-18 LAB — CMP (CANCER CENTER ONLY)
Albumin: 4 g/dL (ref 3.3–5.5)
Alkaline Phosphatase: 38 U/L (ref 26–84)
BUN, Bld: 16 mg/dL (ref 7–22)
Glucose, Bld: 93 mg/dL (ref 73–118)
Potassium: 4.5 mEq/L (ref 3.3–4.7)

## 2012-09-18 MED ORDER — IOHEXOL 300 MG/ML  SOLN
100.0000 mL | Freq: Once | INTRAMUSCULAR | Status: AC | PRN
  Administered 2012-09-18: 100 mL via INTRAVENOUS

## 2012-09-19 ENCOUNTER — Telehealth: Payer: Self-pay | Admitting: *Deleted

## 2012-09-19 NOTE — Telephone Encounter (Signed)
Message copied by Anselm Jungling on Thu Sep 19, 2012  9:09 AM ------      Message from: Arlan Organ R      Created: Thu Sep 19, 2012  7:40 AM       Please call and tell him that his CAT scan looks normal. No evidence of recurrent kidney cancer. Have a merry Christmas and go [employer]!!! Cindee Lame

## 2012-09-19 NOTE — Telephone Encounter (Signed)
Called patient to let him know that his CT scan looked normal per dr. Myna Hidalgo.  No evidence of kidney cancer.

## 2012-09-25 ENCOUNTER — Ambulatory Visit: Payer: Medicare PPO | Admitting: Hematology & Oncology

## 2012-10-01 ENCOUNTER — Ambulatory Visit (HOSPITAL_BASED_OUTPATIENT_CLINIC_OR_DEPARTMENT_OTHER): Payer: Medicare PPO | Admitting: Hematology & Oncology

## 2012-10-01 VITALS — BP 139/70 | HR 69 | Temp 97.5°F | Resp 16 | Wt 191.0 lb

## 2012-10-01 DIAGNOSIS — Z8546 Personal history of malignant neoplasm of prostate: Secondary | ICD-10-CM

## 2012-10-01 DIAGNOSIS — C649 Malignant neoplasm of unspecified kidney, except renal pelvis: Secondary | ICD-10-CM

## 2012-10-01 NOTE — Progress Notes (Signed)
This office note has been dictated.

## 2012-10-02 NOTE — Progress Notes (Signed)
CC:   Nani Gasser, M.D. Heloise Purpura, MD Kendell Bane, MD  DIAGNOSIS: 1. 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 2. History of early stage prostate cancer.  CURRENT THERAPY:  Observation.  INTERIM HISTORY:  Mr. Daino comes in for followup.  He continues to do well.  He is improving daily with his back.  He underwent surgery for his back.  He had a terrible spinal stenosis.  This has relieved his pain almost completely. He had his nephrectomy back in May.  He had no problems with the nephrectomy.  We did go ahead and repeat his scans.  They were done on 12/04.  The CT scan did not show any evidence of recurrent disease with this kidney cancer.  He has had no problems bleeding or bruising.  There is no cough or shortness breath.  He has had no change in bowel or bladder habits.  PHYSICAL EXAMINATION:  General: This is a well-developed, well-nourished white gentleman in no obvious distress.  Vital signs:  Temperature 97.5, pulse 69, respiratory rate 16, blood pressure 139/70.  Weight is 191. Head and neck:  Normocephalic, atraumatic skull.  There are no ocular or oral lesions.  There are no palpable cervical or supraclavicular lymph nodes.  Lungs:  Clear bilaterally.  Cardiac:  Regular rate and rhythm with a normal S1, S2.  There are no murmurs, rubs or bruits.  Abdomen: Soft with good bowel sounds.  There is no palpable abdominal mass. There is no fluid wave.  There is no palpable hepatosplenomegaly.  He has a well-healed laparoscopy scar from his nephrectomy.  Back:  No tenderness of the spine, ribs, or hips.  He has a laminectomy scar in his lumbar spine.  Extremities:  Shows no clubbing, cyanosis or edema. He has good range of motion of his joints.  Neurological:  Shows no focal neurological deficits.  LABORATORY STUDIES:  Show a white cell count of 5.2, hemoglobin 13.8, hematocrit 41.1, platelet count 174.  LDH is 123.  BUN is 16, creatinine  1.1.  IMPRESSION:  Mr. 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  He underwent a nephrectomy in May.  We will go ahead and follow him up in 3 or 4 more months.  We will do a CT scan on him.  I feel this is still the ultimate way of following him.  We will see him back in April after his scans are done.    ______________________________ Josph Macho, M.D. PRE/MEDQ  D:  10/01/2012  T:  10/02/2012  Job:  [id number]

## 2012-10-16 HISTORY — PX: CATARACT EXTRACTION W/ INTRAOCULAR LENS IMPLANT: SHX1309

## 2012-10-24 ENCOUNTER — Other Ambulatory Visit: Payer: Self-pay | Admitting: *Deleted

## 2012-10-24 MED ORDER — QUINAPRIL-HYDROCHLOROTHIAZIDE 10-12.5 MG PO TABS: 1.0000 | ORAL_TABLET | Freq: Every day | ORAL | Status: DC

## 2012-10-28 ENCOUNTER — Other Ambulatory Visit: Payer: Self-pay | Admitting: Family Medicine

## 2012-11-19 ENCOUNTER — Other Ambulatory Visit: Payer: Self-pay | Admitting: Dermatology

## 2013-01-12 ENCOUNTER — Other Ambulatory Visit: Payer: Self-pay | Admitting: Family Medicine

## 2013-01-15 ENCOUNTER — Other Ambulatory Visit (HOSPITAL_BASED_OUTPATIENT_CLINIC_OR_DEPARTMENT_OTHER): Payer: Medicare PPO | Admitting: Lab

## 2013-01-15 ENCOUNTER — Ambulatory Visit (HOSPITAL_BASED_OUTPATIENT_CLINIC_OR_DEPARTMENT_OTHER)
Admission: RE | Admit: 2013-01-15 | Discharge: 2013-01-15 | Disposition: A | Discharge status: Home / Self Care | Payer: Medicare PPO | Source: Ambulatory Visit | Attending: Hematology & Oncology | Admitting: Hematology & Oncology

## 2013-01-15 ENCOUNTER — Encounter (HOSPITAL_BASED_OUTPATIENT_CLINIC_OR_DEPARTMENT_OTHER): Payer: Self-pay

## 2013-01-15 DIAGNOSIS — C649 Malignant neoplasm of unspecified kidney, except renal pelvis: Secondary | ICD-10-CM

## 2013-01-15 DIAGNOSIS — I10 Essential (primary) hypertension: Secondary | ICD-10-CM | POA: Insufficient documentation

## 2013-01-15 DIAGNOSIS — Z905 Acquired absence of kidney: Secondary | ICD-10-CM | POA: Insufficient documentation

## 2013-01-15 LAB — CMP (CANCER CENTER ONLY)
ALT(SGPT): 13 U/L (ref 10–47)
AST: 19 U/L (ref 11–38)
BUN, Bld: 13 mg/dL (ref 7–22)
CO2: 31 mEq/L (ref 18–33)
Calcium: 9.5 mg/dL (ref 8.0–10.3)
Creat: 1 mg/dl (ref 0.6–1.2)
Total Bilirubin: 0.7 mg/dl (ref 0.20–1.60)

## 2013-01-15 LAB — CBC WITH DIFFERENTIAL (CANCER CENTER ONLY)
BASO%: 0.6 % (ref 0.0–2.0)
HCT: 42.6 % (ref 38.7–49.9)
LYMPH%: 28.6 % (ref 14.0–48.0)
MCH: 31 pg (ref 28.0–33.4)
MCHC: 33.6 g/dL (ref 32.0–35.9)
MCV: 92 fL (ref 82–98)
MONO%: 12.9 % (ref 0.0–13.0)
NEUT%: 54.8 % (ref 40.0–80.0)
Platelets: 179 10*3/uL (ref 145–400)
RDW: 13 % (ref 11.1–15.7)

## 2013-01-15 LAB — LACTATE DEHYDROGENASE: LDH: 123 U/L (ref 94–250)

## 2013-01-15 MED ORDER — IOHEXOL 300 MG/ML  SOLN
100.0000 mL | Freq: Once | INTRAMUSCULAR | Status: AC | PRN
  Administered 2013-01-15: 100 mL via INTRAVENOUS

## 2013-01-17 ENCOUNTER — Telehealth: Payer: Self-pay | Admitting: Oncology

## 2013-01-17 NOTE — Telephone Encounter (Addendum)
Message copied by Lacie Draft on Fri Jan 17, 2013  3:49 PM ------      Message from: Arlan Organ R      Created: Wed Jan 15, 2013  6:12 PM       Please call and tell him that his CAT scan looks fantastic. No evidence of cancer coming back. Thanks. Pete Left message on patient's answering machine.Lance Sell Johnson  ------

## 2013-01-22 ENCOUNTER — Ambulatory Visit (HOSPITAL_BASED_OUTPATIENT_CLINIC_OR_DEPARTMENT_OTHER): Payer: Medicare PPO | Admitting: Hematology & Oncology

## 2013-01-22 VITALS — BP 125/68 | HR 69 | Temp 97.9°F | Resp 18 | Ht 69.0 in | Wt 190.0 lb

## 2013-01-22 DIAGNOSIS — C649 Malignant neoplasm of unspecified kidney, except renal pelvis: Secondary | ICD-10-CM

## 2013-01-22 DIAGNOSIS — C641 Malignant neoplasm of right kidney, except renal pelvis: Secondary | ICD-10-CM

## 2013-01-22 DIAGNOSIS — C61 Malignant neoplasm of prostate: Secondary | ICD-10-CM

## 2013-01-22 NOTE — Progress Notes (Signed)
This office note has been dictated.

## 2013-01-23 NOTE — Progress Notes (Signed)
CC:   Nani Gasser, M.D. Heloise Purpura, MD  DIAGNOSES: 1. 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 2. Early-stage prostate cancer.  CURRENT THERAPY:  Observation.  INTERIM HISTORY:  Mr. Mccammon comes in for followup.  We last saw him back in December.  He has been doing pretty well.  He got through the wintertime without many problems.  He will be going down to [place] in June for a vacation to see his daughter.  He is doing well with his back.  He had spinal surgery, which helped relieve a lot of pain.  He has had no problems going to the bathroom.  He had seed implants for the prostate cancer.  He has not noted any problems with fevers, sweats, or chills.  He has had no leg swelling.  There have been no rashes.  He has had no cough or shortness of breath.  We did do his CT scans recently.  The CT scans were done on 01/15/2013. The CT scans did not show any evidence of recurrent kidney cancer.  PHYSICAL EXAMINATION:  General:  This is a well-developed, well- nourished white gentleman in no obvious distress.  Vital signs: Temperature of 97.9, pulse 69, respiratory rate 18, blood pressure 125/68.  Weight is 190.  Head and neck:  Normocephalic, atraumatic skull.  There are no ocular or oral lesions.  There are no palpable cervical or supraclavicular lymph nodes.  Lungs:  Clear to percussion and auscultation bilaterally.  Cardiac:  Regular rate and rhythm, with a normal S1 and S2.  There are no murmurs, rubs, or bruits.  Abdomen: Shows  laparoscopy scars that are well healed.  He has no abdominal mass.  There is no fluid wave.  There is no guarding or rebound tenderness.  There is no palpable hepatosplenomegaly.  Back:  Back exam shows a laminectomy scar in his lumbar spine.  There is no tenderness over the spine, ribs, or hips.  Extremities: Show no clubbing, cyanosis, or edema.  Neurological:  Shows no focal neurological deficits.  LABORATORY  STUDIES:  White cell count is 4.9, hemoglobin 14.3, hematocrit 42.6, platelet count 179,000.  Electrolytes are all within normal limits.  LDH is 123.  IMPRESSION:  Mr. Weatherall is a very nice 78 year old white gentleman with stage I clear-cell carcinoma of the right kidney.  He underwent resection for this back in May 2013.  I think that we can probably go every 6 months for his scans.  I think his risk of recurrence is going to be less than 10%.  I do not need any blood work on him in between visits.    ______________________________ Josph Macho, M.D. PRE/MEDQ  D:  01/22/2013  T:  01/23/2013  Job:  [id number]

## 2013-01-27 ENCOUNTER — Telehealth: Payer: Self-pay | Admitting: Hematology & Oncology

## 2013-01-27 NOTE — Telephone Encounter (Signed)
Pt aware of 10-3 CT at 1030 to drink contrast at 830 and 930am. He is aware to be NPO 4 hrs prior and to get lab at 945am at [facility]

## 2013-03-03 ENCOUNTER — Ambulatory Visit (INDEPENDENT_AMBULATORY_CARE_PROVIDER_SITE_OTHER): Payer: Medicare PPO | Admitting: Family Medicine

## 2013-03-03 ENCOUNTER — Encounter: Payer: Self-pay | Admitting: Family Medicine

## 2013-03-03 VITALS — BP 125/68 | HR 88 | Wt 190.0 lb

## 2013-03-03 DIAGNOSIS — K439 Ventral hernia without obstruction or gangrene: Secondary | ICD-10-CM

## 2013-03-03 NOTE — Progress Notes (Signed)
  Subjective:    Patient ID: Anthony Stark, male    DOB: 10-Mar-1935, 78 y.o.   MRN: [id number]  HPI Incisional Hernia - pt had kidney surgery 1 yr ago and has not done any heavy lifting to cause this buldging in the area he noticed this about 1 wk ago. Gets larger throughout the day. Says really hasn't had that much discomfort with it.  Says more tender when he touches it.  No nausea or vomiting.  Noticed it more after walking in the beach.  Says looks bettter when lays flat.      Review of Systems     Objective:   Physical Exam  Constitutional: He appears well-developed and well-nourished.  HENT:  Head: Normocephalic and atraumatic.  Abdominal:  Has a large 6 x 8 cm abdominal hernia to the right of the umbilucus.  nontender on exam. Today.   Skin: Skin is warm and dry.  Psychiatric: He has a normal mood and affect. His behavior is normal.          Assessment & Plan:  Abdominal Wall Hernia - Will refer to Gen Surg for consult. He doesn't necessarily have to correct his since not causing pain or problems.   We need to call to get back surgery report.

## 2013-03-11 ENCOUNTER — Encounter (INDEPENDENT_AMBULATORY_CARE_PROVIDER_SITE_OTHER): Payer: Self-pay | Admitting: Surgery

## 2013-03-11 ENCOUNTER — Ambulatory Visit (INDEPENDENT_AMBULATORY_CARE_PROVIDER_SITE_OTHER): Payer: Medicare PPO | Admitting: Surgery

## 2013-03-11 VITALS — BP 142/74 | HR 80 | Temp 97.8°F | Resp 16 | Ht 70.5 in | Wt 190.0 lb

## 2013-03-11 DIAGNOSIS — K439 Ventral hernia without obstruction or gangrene: Secondary | ICD-10-CM

## 2013-03-11 NOTE — Progress Notes (Signed)
Patient ID: Anthony Stark, male   DOB: 1935/04/30, 78 y.o.   MRN: [id number]  Chief Complaint  Patient presents with  . New Evaluation    eval abd wall hernia    HPI Anthony Stark is a 78 y.o. male.  Referred by Dr. Christianne Dolin for evaluation of ventral hernia HPI This is a 78 year old male who is status post laparoscopic right radical nephrectomy for renal cell carcinoma by Dr. Heloise Purpura on 02/26/12. Over the last couple of weeks the patient has developed an enlarging bulge just to the right of his umbilicus. This has become fairly uncomfortable but remains reducible. He denies any obstructive symptoms. The patient did have a surveillance CT scan in April of this year. There was no mention of a hernia on the radiologist report but my review does show that there is some muscular defect superior and to the right of the umbilicus.   Past Medical History  Diagnosis Date  . Complication of anesthesia     Pt aware of surgery during hernia repair years ago   . Hypertension   . Sleep apnea     does not use CPAP has not used in 10 years   . Chronic kidney disease   . Arthritis     spinal stenosis   . Cancer     prostate/tx with radiation, right renal   . Constipation     reports constipation due to Hydrocodone   . Renal cell carcinoma 03/28/2012  . Hyperlipidemia     Past Surgical History  Procedure Laterality Date  . Inguinal hernia repair      with second revision  . Vasectomy    . Lt tibial fracture      with plate and screws  . Lt inguinal hernia repair  06-2009  . Hernia repair      bilateral inguinal hernia repairs   . Laparoscopic nephrectomy  02/26/2012    Procedure: LAPAROSCOPIC NEPHRECTOMY;  Surgeon: Crecencio Mc, MD;  Location: [facility];  Service: Urology;  Laterality: Right;  . Lumbar spine surgery    . Eye surgery      History reviewed. No pertinent family history.  Social History History  Substance Use Topics  . Smoking status: Former Smoker    Quit  date: 10/16/2001  . Smokeless tobacco: Never Used  . Alcohol Use: 10.0 oz/week    20 drink(s) per week    No Known Allergies  Current Outpatient Prescriptions  Medication Sig Dispense Refill  . Diphenhydramine-APAP, sleep, (TYLENOL PM EXTRA STRENGTH PO) Take by mouth at bedtime as needed.      . docusate sodium (COLACE) 100 MG capsule Take 100 mg by mouth every morning.       . quinapril-hydrochlorothiazide (ACCURETIC) 10-12.5 MG per tablet Take 1 tablet by mouth daily.  90 tablet  0  . simvastatin (ZOCOR) 20 MG tablet TAKE 1 TABLET AT BEDTIME  90 tablet  0   No current facility-administered medications for this visit.    Review of Systems Review of Systems  Constitutional: Negative for fever, chills and unexpected weight change.  HENT: Negative for hearing loss, congestion, sore throat, trouble swallowing and voice change.   Eyes: Negative for visual disturbance.  Respiratory: Negative for cough and wheezing.   Cardiovascular: Negative for chest pain, palpitations and leg swelling.  Gastrointestinal: Positive for abdominal pain. Negative for nausea, vomiting, diarrhea, constipation, blood in stool, abdominal distention, anal bleeding and rectal pain.  Genitourinary: Negative for hematuria and difficulty urinating.  Musculoskeletal: Negative for arthralgias.  Skin: Negative for rash and wound.  Neurological: Negative for seizures, syncope, weakness and headaches.  Hematological: Negative for adenopathy. Does not bruise/bleed easily.  Psychiatric/Behavioral: Negative for confusion.    Blood pressure 142/74, pulse 80, temperature 97.8 F (36.6 C), temperature source Temporal, resp. rate 16, height 5' 10.5" (1.791 m), weight 190 lb (86.183 kg).  Physical Exam Physical Exam WDWN in NAD HEENT:  EOMI, sclera anicteric Neck:  No masses, no thyromegaly Lungs:  CTA bilaterally; normal respiratory effort CV:  Regular rate and rhythm; no murmurs Abd:  +bowel sounds, soft, non-tender,  protruding hernia sac right of umbilicus; reducible when supine; palpable defect - 3 cm Ext:  Well-perfused; no edema Skin:  Warm, dry; no sign of jaundice  Data Reviewed CT scan 01/15/13 *RADIOLOGY REPORT*  Clinical Data: Recurrent renal cell carcinoma. Right nephrectomy.  CT CHEST, ABDOMEN AND PELVIS WITH CONTRAST  Technique: Multidetector CT imaging of the chest, abdomen and  pelvis was performed following the standard protocol during bolus  administration of intravenous contrast.  Contrast: OMNIPAQUE IOHEXOL 300 MG/ML SOLN,  Comparison: CT 09/18/2012  CT CHEST  Findings: No axillary or supraclavicular lymphadenopathy. No  mediastinal or hilar lymphadenopathy. Coronary calcifications are  present. No pericardial fluid. Esophagus is normal.  Review of the lung parenchyma demonstrates no suspicious pulmonary  nodules. Airways are normal.  IMPRESSION:  No evidence of thoracic metastasis.  CT ABDOMEN AND PELVIS  Findings: There is no focal hepatic lesion. The gallbladder,  pancreas, spleen, adrenal glands, and left kidney are normal. The  tiny exophytic cyst extend from the left kidney are unchanged.  No evidence of nodularity or mass within the right nephrectomy bed.  No evidence of retroperitoneal adenopathy.  The stomach, small bowel, appendix, and colon are normal.  Abdominal aorta is normal caliber. No retroperitoneal periportal  lymphadenopathy.  No free fluid the pelvis. The prostate gland and bladder are  normal. The prostate gland does indent the trigone region of the  bladder. There is no pelvic lymphadenopathy.  Review of bone windows demonstrates no aggressive osseous lesions.  There is postsurgical change in the lower lumbar spine consistent  with laminectomies and posterior decompression.  IMPRESSION:  1. No evidence of local recurrence in the right nephrectomy bed.  2. No evidence of metastasis in the abdomen or pelvis.  Original Report Authenticated By:  Genevive Bi, M.D.   Assessment    Ventral hernia - near umbilicus    Plan    Laparoscopic ventral hernia repair with mesh.  The surgical procedure has been discussed with the patient.  Potential risks, benefits, alternative treatments, and expected outcomes have been explained.  All of the patient's questions at this time have been answered.  The likelihood of reaching the patient's treatment goal is good.  The patient understand the proposed surgical procedure and wishes to proceed.         Aymara Sassi K. 03/11/2013, 4:42 PM

## 2013-03-18 ENCOUNTER — Other Ambulatory Visit: Payer: Self-pay | Admitting: Family Medicine

## 2013-04-08 ENCOUNTER — Encounter (HOSPITAL_COMMUNITY): Payer: Self-pay | Admitting: Pharmacy Technician

## 2013-04-14 ENCOUNTER — Encounter (HOSPITAL_COMMUNITY)
Admission: RE | Admit: 2013-04-14 | Discharge: 2013-04-14 | Disposition: A | Discharge status: Home / Self Care | Payer: Medicare PPO | Source: Ambulatory Visit | Attending: Surgery | Admitting: Surgery

## 2013-04-14 ENCOUNTER — Encounter (HOSPITAL_COMMUNITY): Payer: Self-pay

## 2013-04-14 HISTORY — DX: Personal history of other diseases of the digestive system: Z87.19

## 2013-04-14 LAB — CBC
HCT: 43 % (ref 39.0–52.0)
Platelets: 204 10*3/uL (ref 150–400)
RBC: 4.71 MIL/uL (ref 4.22–5.81)
RDW: 13.1 % (ref 11.5–15.5)
WBC: 5.5 10*3/uL (ref 4.0–10.5)

## 2013-04-14 LAB — BASIC METABOLIC PANEL
BUN: 15 mg/dL (ref 6–23)
CO2: 28 mEq/L (ref 19–32)
Chloride: 99 mEq/L (ref 96–112)
GFR calc Af Amer: 72 mL/min — ABNORMAL LOW (ref >90)
Potassium: 3.7 mEq/L (ref 3.5–5.1)

## 2013-04-14 NOTE — Pre-Procedure Instructions (Signed)
Anthony Stark  04/14/2013   Your procedure is scheduled on:  04/23/13  Report to [facility] at 630 AM.  Call this number if you have problems the morning of surgery: [phone]   Remember:   Do not eat food or drink liquids after midnight.   Take these medicines the morning of surgery with A SIP OF WATER: none   Do not wear jewelry, make-up or nail polish.  Do not wear lotions, powders, or perfumes. You may wear deodorant.  Do not shave 48 hours prior to surgery. Men may shave face and neck.  Do not bring valuables to the hospital.  [facility] is not responsible                   for any belongings or valuables.  Contacts, dentures or bridgework may not be worn into surgery.  Leave suitcase in the car. After surgery it may be brought to your room.  For patients admitted to the hospital, checkout time is 11:00 AM the day of  discharge.   Patients discharged the day of surgery will not be allowed to drive  home.  Name and phone number of your driver: family  Special Instructions: Shower using CHG 2 nights before surgery and the night before surgery.  If you shower the day of surgery use CHG.  Use special wash - you have one bottle of CHG for all showers.  You should use approximately 1/3 of the bottle for each shower.   Please read over the following fact sheets that you were given: Pain Booklet, Coughing and Deep Breathing, MRSA Information and Surgical Site Infection Prevention

## 2013-04-22 MED ORDER — CEFAZOLIN SODIUM-DEXTROSE 2-3 GM-% IV SOLR
2.0000 g | INTRAVENOUS | Status: AC
  Administered 2013-04-23: 2 g via INTRAVENOUS
  Filled 2013-04-22: qty 50

## 2013-04-23 ENCOUNTER — Encounter (HOSPITAL_COMMUNITY): Payer: Self-pay | Admitting: Anesthesiology

## 2013-04-23 ENCOUNTER — Encounter (HOSPITAL_COMMUNITY)
Admission: RE | Disposition: A | Discharge status: Home / Self Care | Payer: Self-pay | Source: Ambulatory Visit | Attending: Surgery

## 2013-04-23 ENCOUNTER — Observation Stay (HOSPITAL_COMMUNITY)
Admission: RE | Admit: 2013-04-23 | Discharge: 2013-04-24 | Disposition: A | Discharge status: Home / Self Care | Payer: Medicare PPO | Source: Ambulatory Visit | Attending: Surgery | Admitting: Surgery

## 2013-04-23 ENCOUNTER — Encounter (HOSPITAL_COMMUNITY): Payer: Self-pay | Admitting: *Deleted

## 2013-04-23 ENCOUNTER — Ambulatory Visit (HOSPITAL_COMMUNITY): Payer: Medicare PPO | Admitting: Anesthesiology

## 2013-04-23 DIAGNOSIS — Z01812 Encounter for preprocedural laboratory examination: Secondary | ICD-10-CM | POA: Insufficient documentation

## 2013-04-23 DIAGNOSIS — I10 Essential (primary) hypertension: Secondary | ICD-10-CM | POA: Insufficient documentation

## 2013-04-23 DIAGNOSIS — Z905 Acquired absence of kidney: Secondary | ICD-10-CM | POA: Insufficient documentation

## 2013-04-23 DIAGNOSIS — Z85528 Personal history of other malignant neoplasm of kidney: Secondary | ICD-10-CM | POA: Insufficient documentation

## 2013-04-23 DIAGNOSIS — Z79899 Other long term (current) drug therapy: Secondary | ICD-10-CM | POA: Insufficient documentation

## 2013-04-23 DIAGNOSIS — K439 Ventral hernia without obstruction or gangrene: Secondary | ICD-10-CM

## 2013-04-23 DIAGNOSIS — Z01810 Encounter for preprocedural cardiovascular examination: Secondary | ICD-10-CM | POA: Insufficient documentation

## 2013-04-23 HISTORY — DX: Spinal stenosis, lumbar region without neurogenic claudication: M48.061

## 2013-04-23 HISTORY — DX: Basal cell carcinoma of skin, unspecified: C44.91

## 2013-04-23 HISTORY — DX: Malignant neoplasm of prostate: C61

## 2013-04-23 HISTORY — PX: VENTRAL HERNIA REPAIR: SHX424

## 2013-04-23 HISTORY — DX: Obstructive sleep apnea (adult) (pediatric): G47.33

## 2013-04-23 HISTORY — PX: INSERTION OF MESH: SHX5868

## 2013-04-23 HISTORY — PX: LAPAROSCOPIC INCISIONAL / UMBILICAL / VENTRAL HERNIA REPAIR: SUR789

## 2013-04-23 HISTORY — DX: Viral pneumonia, unspecified: J12.9

## 2013-04-23 SURGERY — REPAIR, HERNIA, VENTRAL, LAPAROSCOPIC
Anesthesia: General | Site: Abdomen | Wound class: Clean

## 2013-04-23 MED ORDER — ONDANSETRON HCL 4 MG/2ML IJ SOLN: 4.0000 mg | Freq: Four times a day (QID) | INTRAMUSCULAR | Status: DC | PRN

## 2013-04-23 MED ORDER — EPHEDRINE SULFATE 50 MG/ML IJ SOLN
INTRAMUSCULAR | Status: DC | PRN
  Administered 2013-04-23: 10 mg via INTRAVENOUS

## 2013-04-23 MED ORDER — ROCURONIUM BROMIDE 100 MG/10ML IV SOLN
INTRAVENOUS | Status: DC | PRN
  Administered 2013-04-23: 50 mg via INTRAVENOUS

## 2013-04-23 MED ORDER — ONDANSETRON HCL 4 MG/2ML IJ SOLN
INTRAMUSCULAR | Status: DC | PRN
  Administered 2013-04-23: 4 mg via INTRAVENOUS

## 2013-04-23 MED ORDER — HYDROMORPHONE HCL PF 1 MG/ML IJ SOLN
INTRAMUSCULAR | Status: AC
  Filled 2013-04-23: qty 1

## 2013-04-23 MED ORDER — MORPHINE SULFATE 2 MG/ML IJ SOLN: 2.0000 mg | INTRAMUSCULAR | Status: DC | PRN

## 2013-04-23 MED ORDER — CHLORHEXIDINE GLUCONATE 4 % EX LIQD: 1.0000 "application " | Freq: Once | CUTANEOUS | Status: DC

## 2013-04-23 MED ORDER — NEOSTIGMINE METHYLSULFATE 1 MG/ML IJ SOLN
INTRAMUSCULAR | Status: DC | PRN
  Administered 2013-04-23: 5 mg via INTRAVENOUS

## 2013-04-23 MED ORDER — SODIUM CHLORIDE 0.9 % IV SOLN
INTRAVENOUS | Status: DC
  Administered 2013-04-23: 13:00:00 via INTRAVENOUS

## 2013-04-23 MED ORDER — SIMVASTATIN 20 MG PO TABS
20.0000 mg | ORAL_TABLET | Freq: Every day | ORAL | Status: DC
  Administered 2013-04-24: 20 mg via ORAL
  Filled 2013-04-23 (×3): qty 1

## 2013-04-23 MED ORDER — LIDOCAINE HCL (CARDIAC) 20 MG/ML IV SOLN
INTRAVENOUS | Status: DC | PRN
  Administered 2013-04-23: 100 mg via INTRAVENOUS

## 2013-04-23 MED ORDER — ACETAMINOPHEN 10 MG/ML IV SOLN
INTRAVENOUS | Status: DC | PRN
  Administered 2013-04-23: 1000 mg via INTRAVENOUS

## 2013-04-23 MED ORDER — ONDANSETRON HCL 4 MG PO TABS: 4.0000 mg | ORAL_TABLET | Freq: Four times a day (QID) | ORAL | Status: DC | PRN

## 2013-04-23 MED ORDER — HYDROMORPHONE HCL PF 1 MG/ML IJ SOLN
0.2500 mg | INTRAMUSCULAR | Status: DC | PRN
  Administered 2013-04-23 (×4): 0.5 mg via INTRAVENOUS

## 2013-04-23 MED ORDER — OXYCODONE-ACETAMINOPHEN 5-325 MG PO TABS
1.0000 | ORAL_TABLET | ORAL | Status: DC | PRN
  Administered 2013-04-24 (×2): 1 via ORAL
  Filled 2013-04-23: qty 1
  Filled 2013-04-23: qty 2

## 2013-04-23 MED ORDER — QUINAPRIL-HYDROCHLOROTHIAZIDE 10-12.5 MG PO TABS: 1.0000 | ORAL_TABLET | Freq: Every day | ORAL | Status: DC

## 2013-04-23 MED ORDER — METOCLOPRAMIDE HCL 5 MG/ML IJ SOLN: 10.0000 mg | Freq: Once | INTRAMUSCULAR | Status: DC | PRN

## 2013-04-23 MED ORDER — OXYCODONE HCL 5 MG/5ML PO SOLN: 5.0000 mg | Freq: Once | ORAL | Status: DC | PRN

## 2013-04-23 MED ORDER — GLYCOPYRROLATE 0.2 MG/ML IJ SOLN
INTRAMUSCULAR | Status: DC | PRN
  Administered 2013-04-23: .8 mg via INTRAVENOUS

## 2013-04-23 MED ORDER — FENTANYL CITRATE 0.05 MG/ML IJ SOLN
INTRAMUSCULAR | Status: DC | PRN
  Administered 2013-04-23 (×6): 50 ug via INTRAVENOUS

## 2013-04-23 MED ORDER — SODIUM CHLORIDE 0.9 % IR SOLN
Status: DC | PRN
  Administered 2013-04-23: 1000 mL

## 2013-04-23 MED ORDER — 0.9 % SODIUM CHLORIDE (POUR BTL) OPTIME
TOPICAL | Status: DC | PRN
  Administered 2013-04-23: 1000 mL

## 2013-04-23 MED ORDER — BUPIVACAINE-EPINEPHRINE PF 0.25-1:200000 % IJ SOLN
INTRAMUSCULAR | Status: AC
  Filled 2013-04-23: qty 30

## 2013-04-23 MED ORDER — PROPOFOL 10 MG/ML IV BOLUS
INTRAVENOUS | Status: DC | PRN
  Administered 2013-04-23: 200 mg via INTRAVENOUS

## 2013-04-23 MED ORDER — ACETAMINOPHEN 10 MG/ML IV SOLN
INTRAVENOUS | Status: AC
  Filled 2013-04-23: qty 100

## 2013-04-23 MED ORDER — IBUPROFEN 600 MG PO TABS: 600.0000 mg | ORAL_TABLET | Freq: Four times a day (QID) | ORAL | Status: DC | PRN

## 2013-04-23 MED ORDER — LACTATED RINGERS IV SOLN
INTRAVENOUS | Status: DC | PRN
  Administered 2013-04-23 (×2): via INTRAVENOUS

## 2013-04-23 MED ORDER — DOCUSATE SODIUM 100 MG PO CAPS: 100.0000 mg | ORAL_CAPSULE | Freq: Every day | ORAL | Status: DC | PRN

## 2013-04-23 MED ORDER — ARTIFICIAL TEARS OP OINT
TOPICAL_OINTMENT | OPHTHALMIC | Status: DC | PRN
  Administered 2013-04-23: 1 via OPHTHALMIC

## 2013-04-23 MED ORDER — OXYCODONE HCL 5 MG PO TABS: 5.0000 mg | ORAL_TABLET | Freq: Once | ORAL | Status: DC | PRN

## 2013-04-23 MED ORDER — BUPIVACAINE-EPINEPHRINE 0.25% -1:200000 IJ SOLN
INTRAMUSCULAR | Status: DC | PRN
  Administered 2013-04-23: 10 mL

## 2013-04-23 SURGICAL SUPPLY — 52 items
ADH SKN CLS LQ APL DERMABOND (GAUZE/BANDAGES/DRESSINGS) ×1
APPLIER CLIP LOGIC TI 5 (MISCELLANEOUS) IMPLANT
APPLIER CLIP ROT 10 11.4 M/L (STAPLE)
APR CLP MED LRG 11.4X10 (STAPLE)
APR CLP MED LRG 33X5 (MISCELLANEOUS)
BINDER ABD UNIV 12 45-62 (WOUND CARE) IMPLANT
BINDER ABDOMINAL 46IN 62IN (WOUND CARE)
BLADE SURG ROTATE 9660 (MISCELLANEOUS) ×1 IMPLANT
CANISTER SUCTION 2500CC (MISCELLANEOUS) IMPLANT
CHLORAPREP W/TINT 26ML (MISCELLANEOUS) ×2 IMPLANT
CLIP APPLIE ROT 10 11.4 M/L (STAPLE) IMPLANT
CLOTH BEACON ORANGE TIMEOUT ST (SAFETY) ×2 IMPLANT
COVER SURGICAL LIGHT HANDLE (MISCELLANEOUS) ×2 IMPLANT
DECANTER SPIKE VIAL GLASS SM (MISCELLANEOUS) IMPLANT
DERMABOND ADHESIVE PROPEN (GAUZE/BANDAGES/DRESSINGS) ×1
DERMABOND ADVANCED .7 DNX6 (GAUZE/BANDAGES/DRESSINGS) ×1 IMPLANT
DEVICE SECURE STRAP 25 ABSORB (INSTRUMENTS) ×3 IMPLANT
DEVICE TROCAR PUNCTURE CLOSURE (ENDOMECHANICALS) ×2 IMPLANT
DRAPE UTILITY 15X26 W/TAPE STR (DRAPE) ×4 IMPLANT
ELECT REM PT RETURN 9FT ADLT (ELECTROSURGICAL) ×2
ELECTRODE REM PT RTRN 9FT ADLT (ELECTROSURGICAL) ×1 IMPLANT
FILTER SMOKE EVAC LAPAROSHD (FILTER) ×1 IMPLANT
GLOVE BIO SURGEON STRL SZ7 (GLOVE) ×2 IMPLANT
GLOVE BIO SURGEON STRL SZ7.5 (GLOVE) ×2 IMPLANT
GLOVE BIOGEL PI IND STRL 7.0 (GLOVE) IMPLANT
GLOVE BIOGEL PI IND STRL 7.5 (GLOVE) ×1 IMPLANT
GLOVE BIOGEL PI INDICATOR 7.0 (GLOVE) ×1
GLOVE BIOGEL PI INDICATOR 7.5 (GLOVE) ×2
GLOVE SURG SS PI 7.0 STRL IVOR (GLOVE) ×1 IMPLANT
GOWN STRL NON-REIN LRG LVL3 (GOWN DISPOSABLE) ×6 IMPLANT
KIT BASIN OR (CUSTOM PROCEDURE TRAY) ×2 IMPLANT
KIT ROOM TURNOVER OR (KITS) ×2 IMPLANT
MARKER SKIN DUAL TIP RULER LAB (MISCELLANEOUS) ×2 IMPLANT
MESH VENTRALIGHT ST 6IN CRC (Mesh General) ×1 IMPLANT
NDL SPNL 22GX3.5 QUINCKE BK (NEEDLE) ×1 IMPLANT
NEEDLE SPNL 22GX3.5 QUINCKE BK (NEEDLE) ×2 IMPLANT
NS IRRIG 1000ML POUR BTL (IV SOLUTION) ×2 IMPLANT
PAD ARMBOARD 7.5X6 YLW CONV (MISCELLANEOUS) ×4 IMPLANT
SCALPEL HARMONIC ACE (MISCELLANEOUS) IMPLANT
SCISSORS LAP 5X35 DISP (ENDOMECHANICALS) IMPLANT
SET IRRIG TUBING LAPAROSCOPIC (IRRIGATION / IRRIGATOR) ×1 IMPLANT
SLEEVE ENDOPATH XCEL 5M (ENDOMECHANICALS) ×2 IMPLANT
SUT MNCRL AB 4-0 PS2 18 (SUTURE) ×2 IMPLANT
SUT NOVA NAB GS-21 0 18 T12 DT (SUTURE) ×3 IMPLANT
TOWEL OR 17X24 6PK STRL BLUE (TOWEL DISPOSABLE) ×2 IMPLANT
TOWEL OR 17X26 10 PK STRL BLUE (TOWEL DISPOSABLE) ×2 IMPLANT
TRAY FOLEY CATH 14FRSI W/METER (CATHETERS) IMPLANT
TRAY LAPAROSCOPIC (CUSTOM PROCEDURE TRAY) ×2 IMPLANT
TROCAR XCEL BLUNT TIP 100MML (ENDOMECHANICALS) IMPLANT
TROCAR XCEL NON-BLD 11X100MML (ENDOMECHANICALS) ×2 IMPLANT
TROCAR XCEL NON-BLD 5MMX100MML (ENDOMECHANICALS) ×2 IMPLANT
WATER STERILE IRR 1000ML POUR (IV SOLUTION) IMPLANT

## 2013-04-23 NOTE — Transfer of Care (Signed)
Immediate Anesthesia Transfer of Care Note  Patient: Anthony Stark  Procedure(s) Performed: Procedure(s): LAPAROSCOPIC VENTRAL HERNIA (N/A) INSERTION OF MESH (N/A)  Patient Location: PACU  Anesthesia Type:General  Level of Consciousness: sedated and patient cooperative  Airway & Oxygen Therapy: Patient Spontanous Breathing and Patient connected to face mask oxygen  Post-op Assessment: Report given to PACU RN, Post -op Vital signs reviewed and stable and Patient moving all extremities X 4  Post vital signs: Reviewed and stable  Complications: No apparent anesthesia complications

## 2013-04-23 NOTE — Preoperative (Signed)
Beta Blockers   Reason not to administer Beta Blockers:Not Applicable 

## 2013-04-23 NOTE — H&P (Signed)
New Evaluation     eval abd wall hernia   HPI  Anthony Stark is a 78 y.o. male. Referred by Dr. Christianne Dolin for evaluation of ventral hernia  HPI  This is a 78 year old male who is status post laparoscopic right radical nephrectomy for renal cell carcinoma by Dr. Heloise Purpura on 02/26/12. Over the last couple of weeks the patient has developed an enlarging bulge just to the right of his umbilicus. This has become fairly uncomfortable but remains reducible. He denies any obstructive symptoms. The patient did have a surveillance CT scan in April of this year. There was no mention of a hernia on the radiologist report but my review does show that there is some muscular defect superior and to the right of the umbilicus.  Past Medical History   Diagnosis  Date   .  Complication of anesthesia      Pt aware of surgery during hernia repair years ago   .  Hypertension    .  Sleep apnea      does not use CPAP has not used in 10 years   .  Chronic kidney disease    .  Arthritis      spinal stenosis   .  Cancer      prostate/tx with radiation, right renal   .  Constipation      reports constipation due to Hydrocodone   .  Renal cell carcinoma  03/28/2012   .  Hyperlipidemia     Past Surgical History   Procedure  Laterality  Date   .  Inguinal hernia repair       with second revision   .  Vasectomy     .  Lt tibial fracture       with plate and screws   .  Lt inguinal hernia repair   06-2009   .  Hernia repair       bilateral inguinal hernia repairs   .  Laparoscopic nephrectomy   02/26/2012     Procedure: LAPAROSCOPIC NEPHRECTOMY; Surgeon: Crecencio Mc, MD; Location: [facility]; Service: Urology; Laterality: Right;   .  Lumbar spine surgery     .  Eye surgery     History reviewed. No pertinent family history.  Social History  History   Substance Use Topics   .  Smoking status:  Former Smoker     Quit date:  10/16/2001   .  Smokeless tobacco:  Never Used   .  Alcohol Use:  10.0  oz/week     20 drink(s) per week   No Known Allergies  Current Outpatient Prescriptions   Medication  Sig  Dispense  Refill   .  Diphenhydramine-APAP, sleep, (TYLENOL PM EXTRA STRENGTH PO)  Take by mouth at bedtime as needed.     .  docusate sodium (COLACE) 100 MG capsule  Take 100 mg by mouth every morning.     .  quinapril-hydrochlorothiazide (ACCURETIC) 10-12.5 MG per tablet  Take 1 tablet by mouth daily.  90 tablet  0   .  simvastatin (ZOCOR) 20 MG tablet  TAKE 1 TABLET AT BEDTIME  90 tablet  0    No current facility-administered medications for this visit.   Review of Systems  Review of Systems  Constitutional: Negative for fever, chills and unexpected weight change.  HENT: Negative for hearing loss, congestion, sore throat, trouble swallowing and voice change.  Eyes: Negative for visual disturbance.  Respiratory: Negative for cough and wheezing.  Cardiovascular: Negative for chest pain, palpitations and leg swelling.  Gastrointestinal: Positive for abdominal pain. Negative for nausea, vomiting, diarrhea, constipation, blood in stool, abdominal distention, anal bleeding and rectal pain.  Genitourinary: Negative for hematuria and difficulty urinating.  Musculoskeletal: Negative for arthralgias.  Skin: Negative for rash and wound.  Neurological: Negative for seizures, syncope, weakness and headaches.  Hematological: Negative for adenopathy. Does not bruise/bleed easily.  Psychiatric/Behavioral: Negative for confusion.  Blood pressure 142/74, pulse 80, temperature 97.8 F (36.6 C), temperature source Temporal, resp. rate 16, height 5' 10.5" (1.791 m), weight 190 lb (86.183 kg).  Physical Exam  Physical Exam  WDWN in NAD  HEENT: EOMI, sclera anicteric  Neck: No masses, no thyromegaly  Lungs: CTA bilaterally; normal respiratory effort  CV: Regular rate and rhythm; no murmurs  Abd: +bowel sounds, soft, non-tender, protruding hernia sac right of umbilicus; reducible when supine;  palpable defect - 3 cm  Ext: Well-perfused; no edema  Skin: Warm, dry; no sign of jaundice  Data Reviewed  CT scan 01/15/13  *RADIOLOGY REPORT*  Clinical Data: Recurrent renal cell carcinoma. Right nephrectomy.  CT CHEST, ABDOMEN AND PELVIS WITH CONTRAST  Technique: Multidetector CT imaging of the chest, abdomen and  pelvis was performed following the standard protocol during bolus  administration of intravenous contrast.  Contrast: OMNIPAQUE IOHEXOL 300 MG/ML SOLN,  Comparison: CT 09/18/2012  CT CHEST  Findings: No axillary or supraclavicular lymphadenopathy. No  mediastinal or hilar lymphadenopathy. Coronary calcifications are  present. No pericardial fluid. Esophagus is normal.  Review of the lung parenchyma demonstrates no suspicious pulmonary  nodules. Airways are normal.  IMPRESSION:  No evidence of thoracic metastasis.  CT ABDOMEN AND PELVIS  Findings: There is no focal hepatic lesion. The gallbladder,  pancreas, spleen, adrenal glands, and left kidney are normal. The  tiny exophytic cyst extend from the left kidney are unchanged.  No evidence of nodularity or mass within the right nephrectomy bed.  No evidence of retroperitoneal adenopathy.  The stomach, small bowel, appendix, and colon are normal.  Abdominal aorta is normal caliber. No retroperitoneal periportal  lymphadenopathy.  No free fluid the pelvis. The prostate gland and bladder are  normal. The prostate gland does indent the trigone region of the  bladder. There is no pelvic lymphadenopathy.  Review of bone windows demonstrates no aggressive osseous lesions.  There is postsurgical change in the lower lumbar spine consistent  with laminectomies and posterior decompression.  IMPRESSION:  1. No evidence of local recurrence in the right nephrectomy bed.  2. No evidence of metastasis in the abdomen or pelvis.  Original Report Authenticated By: Genevive Bi, M.D.  Assessment  Ventral hernia - near umbilicus   Plan  Laparoscopic ventral hernia repair with mesh. The surgical procedure has been discussed with the patient. Potential risks, benefits, alternative treatments, and expected outcomes have been explained. All of the patient's questions at this time have been answered. The likelihood of reaching the patient's treatment goal is good. The patient understand the proposed surgical procedure and wishes to proceed.    Wilmon Arms. Corliss Skains, MD, [facility]  General/ Trauma Surgery  04/23/2013 8:14 AM

## 2013-04-23 NOTE — Anesthesia Postprocedure Evaluation (Signed)
Anesthesia Post Note  Patient: Anthony Stark  Procedure(s) Performed: Procedure(s) (LRB): LAPAROSCOPIC VENTRAL HERNIA (N/A) INSERTION OF MESH (N/A)  Anesthesia type: General  Patient location: PACU  Post pain: Pain level controlled  Post assessment: Patient's Cardiovascular Status Stable  Last Vitals:  Filed Vitals:   04/23/13 1045  BP:   Pulse: 57  Temp:   Resp: 11    Post vital signs: Reviewed and stable  Level of consciousness: alert  Complications: No apparent anesthesia complications

## 2013-04-23 NOTE — Anesthesia Preprocedure Evaluation (Signed)
Anesthesia Evaluation  Patient identified by MRN, date of birth, ID band Patient awake    Reviewed: Allergy & Precautions, H&P , NPO status , Patient's Chart, lab work & pertinent test results, reviewed documented beta blocker date and time   Airway Mallampati: III TM Distance: >3 FB Neck ROM: full    Dental   Pulmonary sleep apnea ,  breath sounds clear to auscultation        Cardiovascular hypertension, On Medications Rhythm:regular     Neuro/Psych negative neurological ROS  negative psych ROS   GI/Hepatic Neg liver ROS, hiatal hernia,   Endo/Other  negative endocrine ROS  Renal/GU Renal disease  negative genitourinary   Musculoskeletal   Abdominal   Peds  Hematology negative hematology ROS (+)   Anesthesia Other Findings See surgeon's H&P   Reproductive/Obstetrics negative OB ROS                           Anesthesia Physical Anesthesia Plan  ASA: III  Anesthesia Plan: General   Post-op Pain Management:    Induction: Intravenous  Airway Management Planned: Oral ETT  Additional Equipment:   Intra-op Plan:   Post-operative Plan: Extubation in OR  Informed Consent: I have reviewed the patients History and Physical, chart, labs and discussed the procedure including the risks, benefits and alternatives for the proposed anesthesia with the patient or authorized representative who has indicated his/her understanding and acceptance.   Dental Advisory Given  Plan Discussed with: CRNA and Surgeon  Anesthesia Plan Comments:         Anesthesia Quick Evaluation

## 2013-04-23 NOTE — Anesthesia Procedure Notes (Signed)
Procedure Name: Intubation Date/Time: 04/23/2013 8:41 AM Performed by: Sherie Don Pre-anesthesia Checklist: Patient identified, Emergency Drugs available, Suction available, Patient being monitored and Timeout performed Patient Re-evaluated:Patient Re-evaluated prior to inductionOxygen Delivery Method: Circle system utilized Preoxygenation: Pre-oxygenation with 100% oxygen Intubation Type: IV induction Ventilation: Mask ventilation without difficulty and Oral airway inserted - appropriate to patient size Laryngoscope Size: Mac and 3 Grade View: Grade II Tube size: 7.5 mm Number of attempts: 1 Airway Equipment and Method: Stylet Placement Confirmation: ETT inserted through vocal cords under direct vision,  positive ETCO2 and breath sounds checked- equal and bilateral Secured at: 23 cm Tube secured with: Tape Dental Injury: Teeth and Oropharynx as per pre-operative assessment

## 2013-04-23 NOTE — Op Note (Signed)
Laparoscopic Ventral Hernia Repair Procedure Note  Indications: Symptomatic ventral hernia  Pre-operative Diagnosis: ventral hernia  Post-operative Diagnosis: ventral hernia  Surgeon: Sarika Baldini K.   Assistants: none  Anesthesia: General endotracheal anesthesia  ASA Class: 2  Procedure Details  The patient was seen in the Holding Room. The risks, benefits, complications, treatment options, and expected outcomes were discussed with the patient. The possibilities of reaction to medication, pulmonary aspiration, perforation of viscus, bleeding, recurrent infection, the need for additional procedures, failure to diagnose a condition, and creating a complication requiring transfusion or operation were discussed with the patient. The patient concurred with the proposed plan, giving informed consent.  The site of surgery properly noted/marked. The patient was taken to the operating room, identified as Anthony Stark and the procedure verified as laparoscopic ventral hernia repair with mesh. A Time Out was held and the above information confirmed.    The patient was placed supine.  After establishing general anesthesia, the abdomen was prepped with Chloraprep and draped in standard fashion.  A 5 mm Optiview was used the cannulate the peritoneal cavity in the left upper quadrant below the costal margin.  Pneumoperitoneum was obtained by insufflating CO2, maintaining a maximum pressure of 15 mmHg.  The 5 mm 30-degree laparoscopic was inserted.  There were significant omental adhesions to the anterior abdominal wall in and around the hernia defect.  An 11-mm port was placed in the left anterior axillary line at the level of the umbilicus.  Another 5-mm port was placed in the left lower quadrant.  Cautery scissors and gentle traction were used to dissect the omental adhesions away from the anterior abdominal wall.  We cleared the entire abdominal wall and were able to visualize one fascial defect. We used  a spinal needle to identify the extent of the hernia defects.  This covered an area of about 5 cms.  We selected a 15.2 cm round piece of Ventralight mesh.  We placed stay sutures of 0 Novofil around the edges of the mesh.  The mesh was then rolled up and inserted through the 11 mm port site.  The mesh was then unrolled.  The stay sutures were then pulled up through small stab incisions using the Endo-close device.  This deployed the mesh widely over the fascial defects.  The stay sutures were then tied down.  The Secure Strap device was then used to tack down the edges of the mesh at 1 cm intervals circumferentially.  We placed a few tacks inside the outer ring of tacks.  We inspected for hemostasis.  The fascial defect at the 11 mm port site was closed with a 0 Vicryl using the Endoclose device.  Pneumoperitoneum was then released as we removed the remainder of the trocars.  The port sites were closed with 4-0 Monocryl.  All of the incisions and stay suture sites were then sealed with Dermabond.  An abdominal binder was placed around the patient's abdomen.  The patient was extubated and brought to the recovery room in stable condition.  All sponge, instrument, and needle counts were correct prior to closure and at the conclusion of the case.   Findings: A single 5 cm fascial defect to the right of the umbilicus  Estimated Blood Loss:  less than 50 mL         Complications:  None; patient tolerated the procedure well.         Disposition: PACU - hemodynamically stable.  Condition: stable   Wilmon Arms. Corliss Skains, MD, [facility] Surgery  General/ Trauma Surgery  04/23/2013 9:52 AM

## 2013-04-24 ENCOUNTER — Encounter (HOSPITAL_COMMUNITY): Payer: Self-pay | Admitting: Surgery

## 2013-04-24 MED ORDER — OXYCODONE-ACETAMINOPHEN 5-325 MG PO TABS: 1.0000 | ORAL_TABLET | ORAL | Status: DC | PRN

## 2013-04-24 NOTE — Discharge Summary (Signed)
Physician Discharge Summary  Patient ID: Anthony Stark MRN: [id number] DOB/AGE: May 08, 1935 78 y.o.  Admit date: 04/23/2013 Discharge date: 04/24/2013  Admission Diagnoses:  Ventral hernia  Discharge Diagnoses: same Active Problems:   * No active hospital problems. *   Discharged Condition: good  Hospital Course: Laparoscopic ventral hernia repair with mesh 04/23/13.  Stayed overnight for pain control.  Doing well on POD#1  Consults: None  Significant Diagnostic Studies: none   Treatments: surgery: lap VH repair  Discharge Exam: Blood pressure 140/76, pulse 82, temperature 98.6 F (37 C), temperature source Oral, resp. rate 18, SpO2 94.00%. General appearance: alert, cooperative and no distress Incisions c/d/i  Disposition: 01-Home or Self Care  Discharge Orders   Future Appointments Provider Department Dept Phone   07/18/2013 9:45 AM [facility] Only [facility] [phone]   07/18/2013 10:30 AM [facility] [phone]   Patient to arrive 15 minutes prior to appointment time.   07/25/2013 10:15 AM Josph Macho, MD  [facility] [phone]   Future Orders Complete By Expires     Call MD for:  persistant nausea and vomiting  As directed     Call MD for:  redness, tenderness, or signs of infection (pain, swelling, redness, odor or green/yellow discharge around incision site)  As directed     Call MD for:  severe uncontrolled pain  As directed     Call MD for:  temperature >100.4  As directed     Diet general  As directed     Driving Restrictions  As directed     Comments:      Do not drive while taking pain medications    Increase activity slowly  As directed     May shower / Bathe  As directed     May walk up steps  As directed         Medication List         docusate sodium 100 MG capsule  Commonly known as:  COLACE  Take 100 mg by mouth daily as needed for  constipation.     oxyCODONE-acetaminophen 5-325 MG per tablet  Commonly known as:  PERCOCET/ROXICET  Take 1-2 tablets by mouth every 4 (four) hours as needed.     quinapril-hydrochlorothiazide 10-12.5 MG per tablet  Commonly known as:  ACCURETIC  Take 1 tablet by mouth daily with breakfast.     simvastatin 20 MG tablet  Commonly known as:  ZOCOR  Take 20 mg by mouth daily with breakfast.     TYLENOL PM EXTRA STRENGTH PO  Take 2 tablets by mouth at bedtime.           Follow-up Information   Follow up with Azarias Chiou K., MD. Schedule an appointment as soon as possible for a visit in 3 weeks.   Contact information:   [address] [place] [address] [phone]       Signed: Wynona Luna. 04/24/2013, 6:55 AM

## 2013-04-24 NOTE — Discharge Summary (Signed)
Pt being d/ced home with wife, after breakfast.  Incision sites are clean and intact.  Will f/u with MD within 3 weeks.

## 2013-05-16 ENCOUNTER — Ambulatory Visit (INDEPENDENT_AMBULATORY_CARE_PROVIDER_SITE_OTHER): Payer: Medicare PPO | Admitting: Surgery

## 2013-05-16 ENCOUNTER — Encounter (INDEPENDENT_AMBULATORY_CARE_PROVIDER_SITE_OTHER): Payer: Self-pay | Admitting: Surgery

## 2013-05-16 VITALS — BP 132/80 | HR 78 | Temp 98.0°F | Resp 18 | Ht 70.5 in | Wt 189.0 lb

## 2013-05-16 DIAGNOSIS — K439 Ventral hernia without obstruction or gangrene: Secondary | ICD-10-CM

## 2013-05-16 NOTE — Progress Notes (Signed)
Status post laparoscopic ventral her mesh on 04/23/13. The patient is doing quite well. He has no significant abdominal pain. His incisions are all well-healed with no sign of infection. No hematoma or seroma at the site of the old hernia. The patient is resuming his normal function. He will refrain from any heavy lifting for another 2 weeks. Followup as needed.   Wilmon Arms. Corliss Skains, MD, [facility] Surgery  General/ Trauma Surgery  05/16/2013 12:53 PM

## 2013-06-17 ENCOUNTER — Other Ambulatory Visit: Payer: Self-pay | Admitting: Family Medicine

## 2013-07-18 ENCOUNTER — Ambulatory Visit (HOSPITAL_COMMUNITY)
Admission: RE | Admit: 2013-07-18 | Discharge: 2013-07-18 | Disposition: A | Discharge status: Home / Self Care | Payer: Medicare PPO | Source: Ambulatory Visit | Attending: Hematology & Oncology | Admitting: Hematology & Oncology

## 2013-07-18 ENCOUNTER — Other Ambulatory Visit (HOSPITAL_BASED_OUTPATIENT_CLINIC_OR_DEPARTMENT_OTHER): Payer: Medicare PPO

## 2013-07-18 DIAGNOSIS — C649 Malignant neoplasm of unspecified kidney, except renal pelvis: Secondary | ICD-10-CM | POA: Insufficient documentation

## 2013-07-18 DIAGNOSIS — Z8546 Personal history of malignant neoplasm of prostate: Secondary | ICD-10-CM | POA: Insufficient documentation

## 2013-07-18 DIAGNOSIS — Z905 Acquired absence of kidney: Secondary | ICD-10-CM | POA: Insufficient documentation

## 2013-07-18 DIAGNOSIS — C641 Malignant neoplasm of right kidney, except renal pelvis: Secondary | ICD-10-CM

## 2013-07-18 DIAGNOSIS — N400 Enlarged prostate without lower urinary tract symptoms: Secondary | ICD-10-CM | POA: Insufficient documentation

## 2013-07-18 LAB — LACTATE DEHYDROGENASE (CC13): LDH: 137 U/L (ref 125–245)

## 2013-07-18 LAB — CBC WITH DIFFERENTIAL/PLATELET
BASO%: 1.5 % (ref 0.0–2.0)
Basophils Absolute: 0.1 10*3/uL (ref 0.0–0.1)
HCT: 43.3 % (ref 38.4–49.9)
LYMPH%: 25.2 % (ref 14.0–49.0)
MCH: 30.8 pg (ref 27.2–33.4)
MCHC: 33.8 g/dL (ref 32.0–36.0)
MCV: 91 fL (ref 79.3–98.0)
MONO#: 0.7 10*3/uL (ref 0.1–0.9)
MONO%: 10.6 % (ref 0.0–14.0)
Platelets: 191 10*3/uL (ref 140–400)
RBC: 4.76 10*6/uL (ref 4.20–5.82)
WBC: 6.7 10*3/uL (ref 4.0–10.3)
lymph#: 1.7 10*3/uL (ref 0.9–3.3)

## 2013-07-18 LAB — COMPREHENSIVE METABOLIC PANEL (CC13)
ALT: 20 U/L (ref 0–55)
AST: 19 U/L (ref 5–34)
Alkaline Phosphatase: 41 U/L (ref 40–150)
CO2: 28 mEq/L (ref 22–29)
Sodium: 139 mEq/L (ref 136–145)
Total Bilirubin: 0.59 mg/dL (ref 0.20–1.20)
Total Protein: 7.5 g/dL (ref 6.4–8.3)

## 2013-07-18 LAB — TECHNOLOGIST REVIEW

## 2013-07-18 MED ORDER — IOHEXOL 300 MG/ML  SOLN
100.0000 mL | Freq: Once | INTRAMUSCULAR | Status: AC | PRN
  Administered 2013-07-18: 100 mL via INTRAVENOUS

## 2013-07-25 ENCOUNTER — Ambulatory Visit (HOSPITAL_BASED_OUTPATIENT_CLINIC_OR_DEPARTMENT_OTHER): Payer: Medicare PPO | Admitting: Hematology & Oncology

## 2013-07-25 VITALS — BP 131/74 | HR 82 | Temp 98.1°F | Resp 18 | Ht 70.0 in | Wt 192.0 lb

## 2013-07-25 DIAGNOSIS — C641 Malignant neoplasm of right kidney, except renal pelvis: Secondary | ICD-10-CM

## 2013-07-25 DIAGNOSIS — C649 Malignant neoplasm of unspecified kidney, except renal pelvis: Secondary | ICD-10-CM

## 2013-07-25 NOTE — Progress Notes (Signed)
This office note has been dictated.

## 2013-07-26 NOTE — Progress Notes (Signed)
CC:   Nani Gasser, M.D. Heloise Purpura, MD Wilmon Arms. Tsuei, M.D.  DIAGNOSES: 1. 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 2. Early-stage prostate cancer.  CURRENT THERAPY:  Observation.  INTERIM HISTORY:  Anthony Stark comes in for his followup.  We see him every 6 months.  We did go ahead and repeat scan on him.  This was done on October 3rd.  The scan did not show any evidence of recurrent kidney cancer.  He recently had some surgery done for an abdominal wall hernia.  This was done on July 9th.  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  He has had no problems with bowels or bladder.  He has had no leg swelling.  He has had no rashes.  His back surgery that he had over a year ago really has helped him out quite a bit.  He has had no cough or shortness of breath.  There is no headache.  PHYSICAL EXAMINATION:  General:  This is a well-developed, well- nourished white gentleman in no obvious distress.  Vital signs: Temperature of 98.1, pulse 82, respiratory rate 18, blood pressure 131/74.  Weight is 192 pounds.  Head and neck:  Normocephalic, atraumatic skull.  There are no ocular or oral lesions.  There are no palpable cervical or supraclavicular lymph nodes.  Lungs:  Clear bilaterally.  Cardiac:  Regular rate and rhythm with a normal S1 and S2. There are no murmurs, rubs or bruits.  Abdomen:  Soft.  He has well- healed laparoscopy scars.  There is no fluid wave.  There is no palpable abdominal mass.  There is no palpable hepatosplenomegaly.  Extremities: No clubbing, cyanosis or edema.  He has good strength in his legs.  He has good range motion of his joints.  Neurologic:  No focal neurological deficits.  Skin:  No rashes, ecchymoses or petechiae.  LABORATORY STUDIES:  White cell count is 6.7, hemoglobin 14.7, hematocrit 43.3, platelet count 191.  LDH is 137.  Calcium is 9.9 with an albumin of 4.3.  IMPRESSION:  Anthony Stark is a 78 year old gentleman  with a history of stage I clear cell carcinoma of the right kidney.  This was resected. He had this resected back in May 2013.  We will to follow him every 6 months.  I think that once we get him through next year, then we should be good to go without having to do any further scans.  I will set his scans up in April.  I will see him back afterwards.    ______________________________ Josph Macho, M.D. PRE/MEDQ  D:  07/25/2013  T:  07/26/2013  Job:  [id number]

## 2013-08-04 ENCOUNTER — Encounter: Payer: Self-pay | Admitting: Hematology & Oncology

## 2013-08-04 ENCOUNTER — Telehealth: Payer: Self-pay | Admitting: Hematology & Oncology

## 2013-08-04 NOTE — Telephone Encounter (Signed)
Left message on phone to call for April appointments and mailed schedule with CT instructions and letter.

## 2013-08-10 ENCOUNTER — Other Ambulatory Visit: Payer: Self-pay | Admitting: Family Medicine

## 2013-08-11 ENCOUNTER — Telehealth: Payer: Self-pay | Admitting: Hematology & Oncology

## 2013-08-11 ENCOUNTER — Other Ambulatory Visit: Payer: Self-pay | Admitting: Family Medicine

## 2013-08-11 NOTE — Telephone Encounter (Signed)
Pt aware of 4-2 CT at [facility] at 10 am to drink contrast at 8 and 9 am, and to be NPO 4 hours. He is also aware of 4-2 lab at 915 am and MD appointment on 4-9

## 2013-08-11 NOTE — Telephone Encounter (Signed)
Needs f/u appt before future refills 

## 2013-08-20 ENCOUNTER — Telehealth: Payer: Self-pay | Admitting: Family Medicine

## 2013-08-20 ENCOUNTER — Ambulatory Visit (INDEPENDENT_AMBULATORY_CARE_PROVIDER_SITE_OTHER): Payer: Medicare PPO | Admitting: Family Medicine

## 2013-08-20 ENCOUNTER — Encounter: Payer: Self-pay | Admitting: Family Medicine

## 2013-08-20 VITALS — BP 112/65 | HR 77 | Wt 195.0 lb

## 2013-08-20 DIAGNOSIS — C661 Malignant neoplasm of right ureter: Secondary | ICD-10-CM

## 2013-08-20 DIAGNOSIS — I10 Essential (primary) hypertension: Secondary | ICD-10-CM

## 2013-08-20 DIAGNOSIS — Z1211 Encounter for screening for malignant neoplasm of colon: Secondary | ICD-10-CM

## 2013-08-20 DIAGNOSIS — E785 Hyperlipidemia, unspecified: Secondary | ICD-10-CM

## 2013-08-20 LAB — LIPID PANEL
Cholesterol: 174 mg/dL (ref 0–200)
HDL: 64 mg/dL (ref >39)
LDL Cholesterol: 87 mg/dL (ref 0–99)
Total CHOL/HDL Ratio: 2.7 Ratio
Triglycerides: 116 mg/dL (ref <150)
VLDL: 23 mg/dL (ref 0–40)

## 2013-08-20 LAB — COMPLETE METABOLIC PANEL WITH GFR
ALT: 23 U/L (ref 0–53)
AST: 21 U/L (ref 0–37)
Alkaline Phosphatase: 37 U/L — ABNORMAL LOW (ref 39–117)
BUN: 15 mg/dL (ref 6–23)
Calcium: 9.6 mg/dL (ref 8.4–10.5)
Creat: 1.07 mg/dL (ref 0.50–1.35)
GFR, Est African American: 78 mL/min
Potassium: 4.4 mEq/L (ref 3.5–5.3)
Total Bilirubin: 0.6 mg/dL (ref 0.3–1.2)
Total Protein: 7.3 g/dL (ref 6.0–8.3)

## 2013-08-20 MED ORDER — QUINAPRIL HCL 10 MG PO TABS: 10.0000 mg | ORAL_TABLET | Freq: Every day | ORAL | Status: DC

## 2013-08-20 NOTE — Telephone Encounter (Signed)
Pt called and told of recommendations and is okay with [facility].Loralee Pacas [place]

## 2013-08-20 NOTE — Telephone Encounter (Signed)
Please call patient. We did get a copy of his old colonoscopy in March of 2004. They had actually recommended repeating in 5 years because of polyps found on your exam. This means that he was technically do in 2009. If he is okay with referral back to get him set up for screening colonoscopy. We can do this again at [facility] or if he prefers to do summer THEN please just let me know.

## 2013-08-20 NOTE — Progress Notes (Signed)
  Subjective:    Patient ID: Anthony Stark, male    DOB: [birth year], 78 y.o.   MRN: [id number]  HPI HTN-  Pt denies chest pain, SOB, dizziness, or heart palpitations.  Taking meds as directed w/o problems.  Denies medication side effects.  Blood pressure has been running about the same home as is here today.   Hyperlipidemia - doing well on statin. No side effects or myalgias on the medication.  Prostate Ca in remision - follows yearly with Urology.   Review of Systems     Objective:   Physical Exam  Constitutional: He is oriented to person, place, and time. He appears well-developed and well-nourished.  HENT:  Head: Normocephalic and atraumatic.  Cardiovascular: Normal rate, regular rhythm and normal heart sounds.   Pulmonary/Chest: Effort normal and breath sounds normal.  Neurological: He is alert and oriented to person, place, and time.  Skin: Skin is warm and dry.  Psychiatric: He has a normal mood and affect. His behavior is normal.          Assessment & Plan:  HTN - Well conrolled. In fact blood pressure is him is a little bit low today. Continue current regimen. We discussed diet and exercise. We iscussed discontinuing the hydrochlorothiazide component of the quinapril HCT. Follow up in 2-3 months to recheck blood pressure.  Hyperlipidemia - doing well. Last labs in January 2014 look great. Repeat in January or February of this next year.

## 2013-08-20 NOTE — Progress Notes (Signed)
Faxed request for colonoscopy report to be sent from [facility] [phone] .Marland KitchenLoralee Pacas [place]

## 2013-09-02 ENCOUNTER — Encounter: Payer: Self-pay | Admitting: Family Medicine

## 2013-09-30 ENCOUNTER — Other Ambulatory Visit: Payer: Self-pay | Admitting: *Deleted

## 2013-09-30 MED ORDER — SIMVASTATIN 20 MG PO TABS: 20.0000 mg | ORAL_TABLET | Freq: Every day | ORAL | Status: DC

## 2013-10-14 ENCOUNTER — Other Ambulatory Visit: Payer: Self-pay | Admitting: Family Medicine

## 2013-11-17 ENCOUNTER — Telehealth: Payer: Self-pay | Admitting: Hematology & Oncology

## 2013-11-17 NOTE — Telephone Encounter (Signed)
Pt moved 4-2 CT to 3-36

## 2013-11-19 ENCOUNTER — Other Ambulatory Visit: Payer: Self-pay | Admitting: Gastroenterology

## 2013-11-24 ENCOUNTER — Other Ambulatory Visit: Payer: Self-pay | Admitting: Family Medicine

## 2013-11-25 ENCOUNTER — Other Ambulatory Visit: Payer: Self-pay | Admitting: Family Medicine

## 2013-12-26 ENCOUNTER — Encounter: Payer: Self-pay | Admitting: Family Medicine

## 2014-01-08 ENCOUNTER — Encounter (HOSPITAL_COMMUNITY): Payer: Self-pay

## 2014-01-08 ENCOUNTER — Ambulatory Visit (HOSPITAL_COMMUNITY)
Admission: RE | Admit: 2014-01-08 | Discharge: 2014-01-08 | Disposition: A | Discharge status: Home / Self Care | Payer: Medicare Other | Source: Ambulatory Visit | Attending: Hematology & Oncology | Admitting: Hematology & Oncology

## 2014-01-08 ENCOUNTER — Other Ambulatory Visit (HOSPITAL_BASED_OUTPATIENT_CLINIC_OR_DEPARTMENT_OTHER): Payer: Medicare Other

## 2014-01-08 ENCOUNTER — Other Ambulatory Visit: Payer: Self-pay | Admitting: Hematology & Oncology

## 2014-01-08 ENCOUNTER — Telehealth: Payer: Self-pay | Admitting: *Deleted

## 2014-01-08 DIAGNOSIS — C641 Malignant neoplasm of right kidney, except renal pelvis: Secondary | ICD-10-CM

## 2014-01-08 DIAGNOSIS — Z8546 Personal history of malignant neoplasm of prostate: Secondary | ICD-10-CM | POA: Insufficient documentation

## 2014-01-08 DIAGNOSIS — N400 Enlarged prostate without lower urinary tract symptoms: Secondary | ICD-10-CM | POA: Insufficient documentation

## 2014-01-08 DIAGNOSIS — Z905 Acquired absence of kidney: Secondary | ICD-10-CM | POA: Insufficient documentation

## 2014-01-08 DIAGNOSIS — Q6101 Congenital single renal cyst: Secondary | ICD-10-CM | POA: Insufficient documentation

## 2014-01-08 DIAGNOSIS — C649 Malignant neoplasm of unspecified kidney, except renal pelvis: Secondary | ICD-10-CM

## 2014-01-08 DIAGNOSIS — Z85528 Personal history of other malignant neoplasm of kidney: Secondary | ICD-10-CM | POA: Insufficient documentation

## 2014-01-08 LAB — CBC WITH DIFFERENTIAL/PLATELET
BASO%: 1 % (ref 0.0–2.0)
BASOS ABS: 0.1 10*3/uL (ref 0.0–0.1)
EOS%: 2.4 % (ref 0.0–7.0)
Eosinophils Absolute: 0.2 10*3/uL (ref 0.0–0.5)
HCT: 45.2 % (ref 38.4–49.9)
HEMOGLOBIN: 15 g/dL (ref 13.0–17.1)
LYMPH%: 26.4 % (ref 14.0–49.0)
MCH: 30.8 pg (ref 27.2–33.4)
MCHC: 33.3 g/dL (ref 32.0–36.0)
MCV: 92.5 fL (ref 79.3–98.0)
MONO#: 0.8 10*3/uL (ref 0.1–0.9)
MONO%: 10.7 % (ref 0.0–14.0)
NEUT#: 4.2 10*3/uL (ref 1.5–6.5)
NEUT%: 59.5 % (ref 39.0–75.0)
PLATELETS: 202 10*3/uL (ref 140–400)
RBC: 4.88 10*6/uL (ref 4.20–5.82)
RDW: 13.3 % (ref 11.0–14.6)
WBC: 7 10*3/uL (ref 4.0–10.3)
lymph#: 1.9 10*3/uL (ref 0.9–3.3)

## 2014-01-08 LAB — COMPREHENSIVE METABOLIC PANEL (CC13)
ALK PHOS: 44 U/L (ref 40–150)
ALT: 17 U/L (ref 0–55)
ANION GAP: 12 meq/L — AB (ref 3–11)
AST: 17 U/L (ref 5–34)
Albumin: 4.7 g/dL (ref 3.5–5.0)
BUN: 13.5 mg/dL (ref 7.0–26.0)
CALCIUM: 10 mg/dL (ref 8.4–10.4)
CO2: 27 mEq/L (ref 22–29)
Chloride: 102 mEq/L (ref 98–109)
Creatinine: 1.2 mg/dL (ref 0.7–1.3)
GLUCOSE: 94 mg/dL (ref 70–140)
Potassium: 4.2 mEq/L (ref 3.5–5.1)
Sodium: 141 mEq/L (ref 136–145)
TOTAL PROTEIN: 7.6 g/dL (ref 6.4–8.3)
Total Bilirubin: 0.5 mg/dL (ref 0.20–1.20)

## 2014-01-08 LAB — LACTATE DEHYDROGENASE (CC13): LDH: 145 U/L (ref 125–245)

## 2014-01-08 MED ORDER — IOHEXOL 300 MG/ML  SOLN
100.0000 mL | Freq: Once | INTRAMUSCULAR | Status: AC | PRN
  Administered 2014-01-08: 100 mL via INTRAVENOUS

## 2014-01-08 NOTE — Telephone Encounter (Signed)
Message copied by Rico Ala on Thu Jan 08, 2014  1:16 PM ------      Message from: Burney Gauze R      Created: Thu Jan 08, 2014  1:11 PM       Call - ct scans and labs are excellent!!  No obvious cancer!!  Laurey Arrow ------

## 2014-01-08 NOTE — Telephone Encounter (Signed)
Spoke with pt via phone per Dr Linton Ham note with CT and lab results. dph

## 2014-01-11 IMAGING — CR DG PELVIS 1-2V
1 series · 1 of 1 positions shown · non-contrast
Comparison: Lumbar spine films same date

CLINICAL DATA: Low back pain and left hip pain after working on
lawn mower 5 days ago.

PELVIS - 1-2 VIEW

[view not recorded]
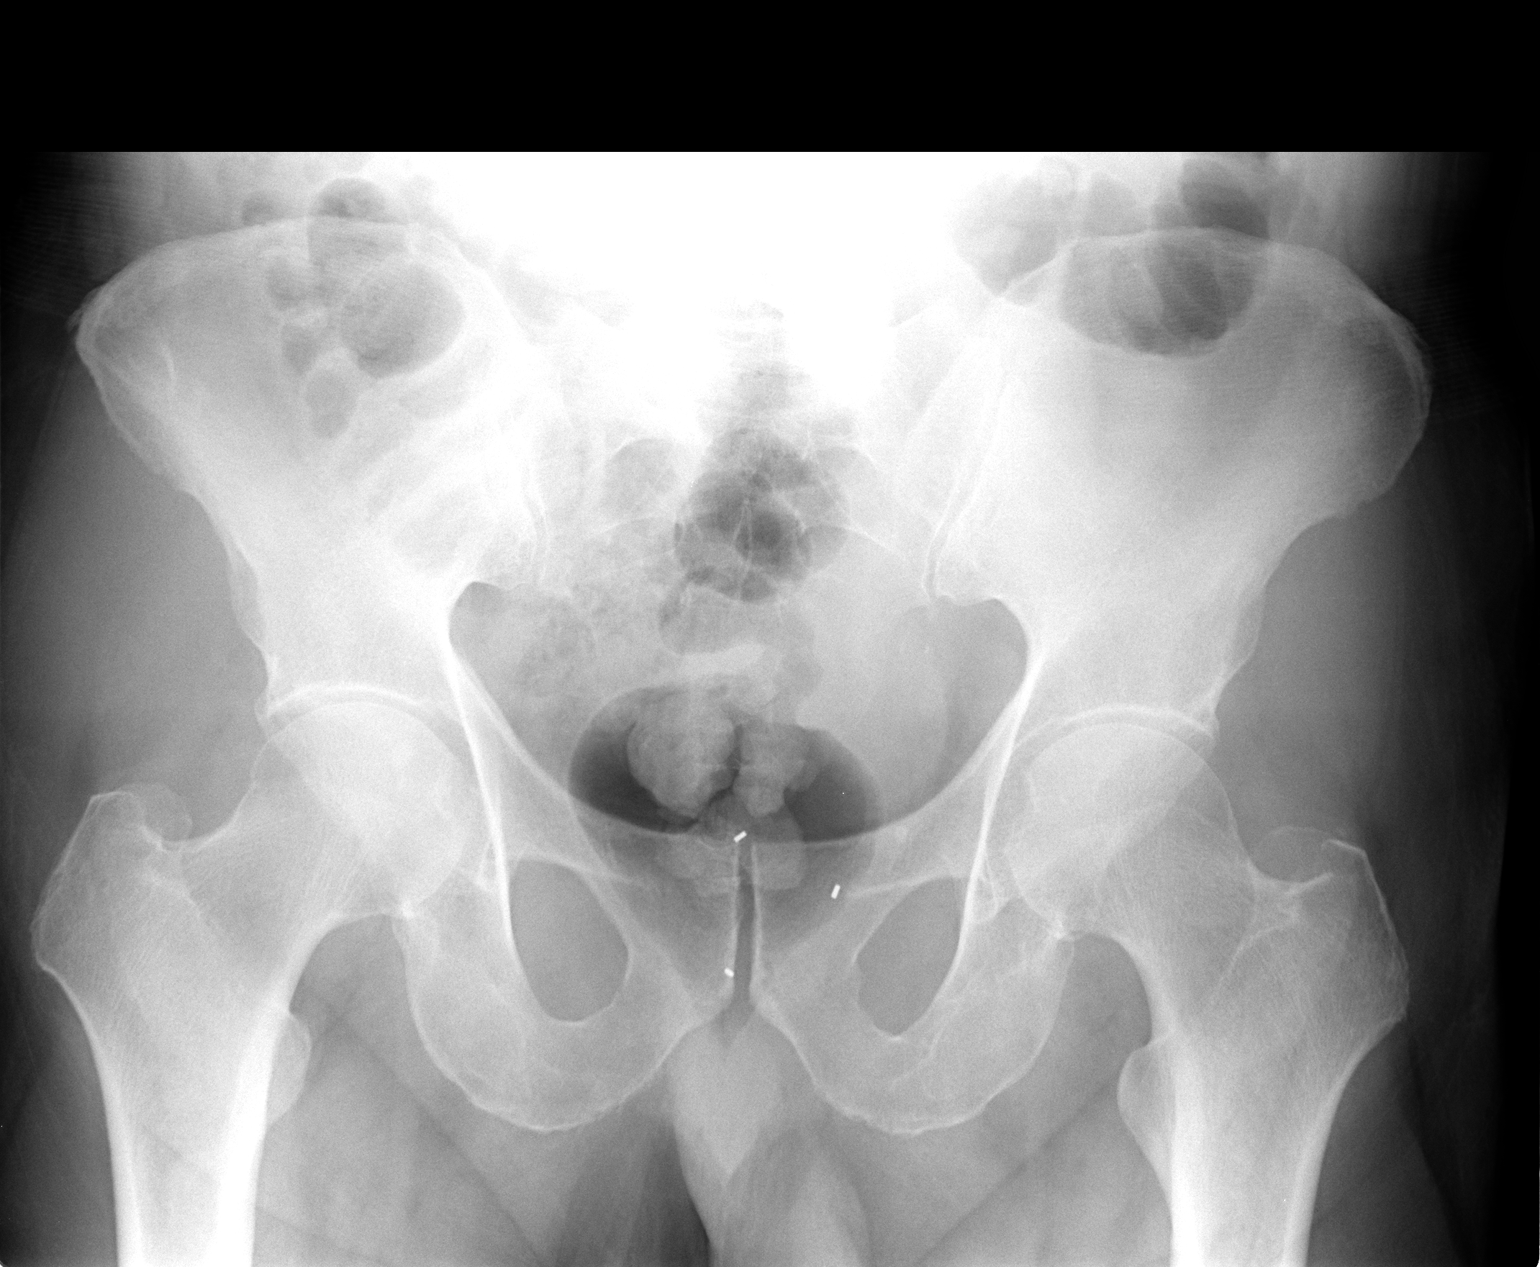

[1 of 1 positions shown; findings below may reference images not displayed]

FINDINGS: Single AP view of the pelvis. Sacroiliac joints are
symmetric.  No acute fracture or dislocation.  Joint spaces
maintained for age.
IMPRESSION: No acute osseous abnormality.

## 2014-01-11 IMAGING — CR DG LUMBAR SPINE COMPLETE 4+V
5 series · 5 of 5 positions shown · non-contrast
Comparison: None.

CLINICAL DATA: Low back and left hip pain.

LUMBAR SPINE - COMPLETE 4+ VIEW

[view not recorded (1 of 5)]
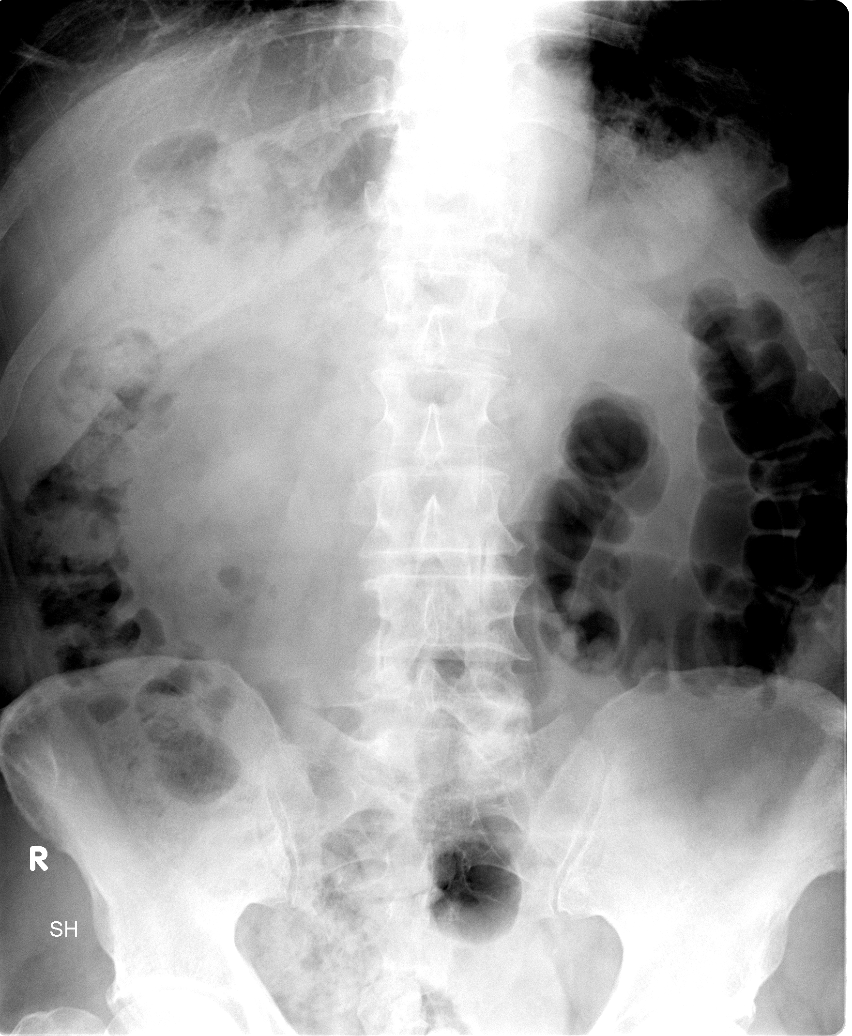

[view not recorded (2 of 5)]
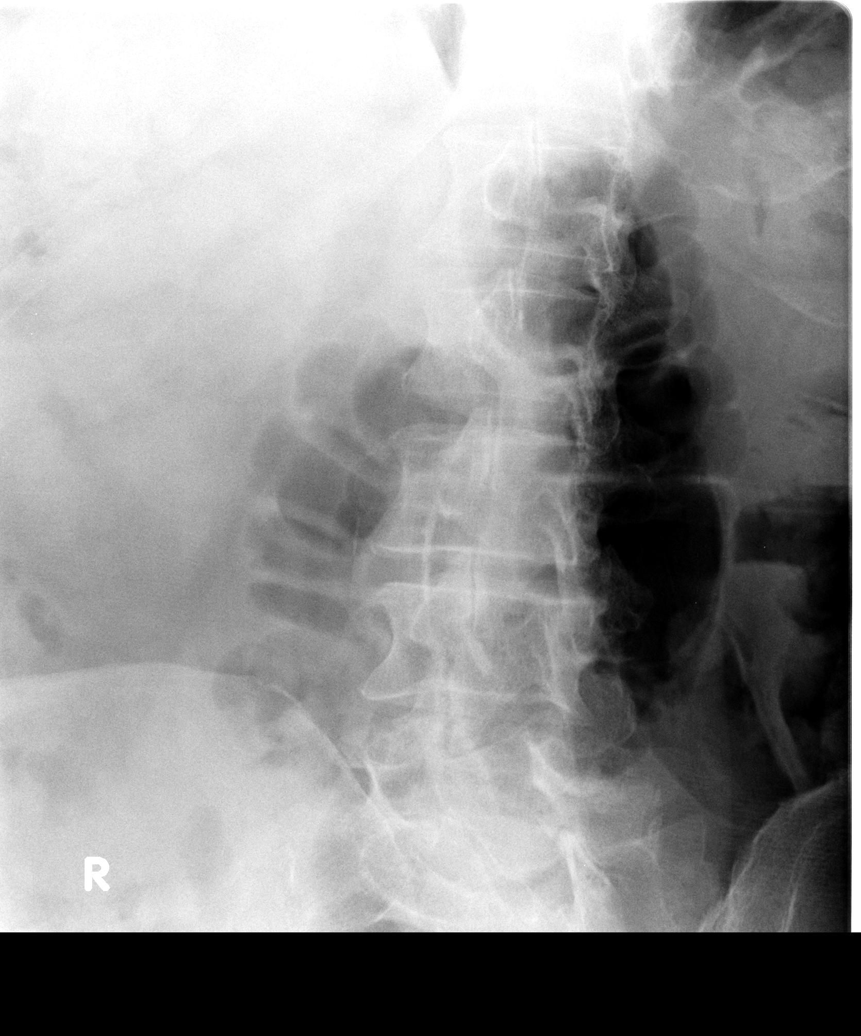

[view not recorded (3 of 5)]
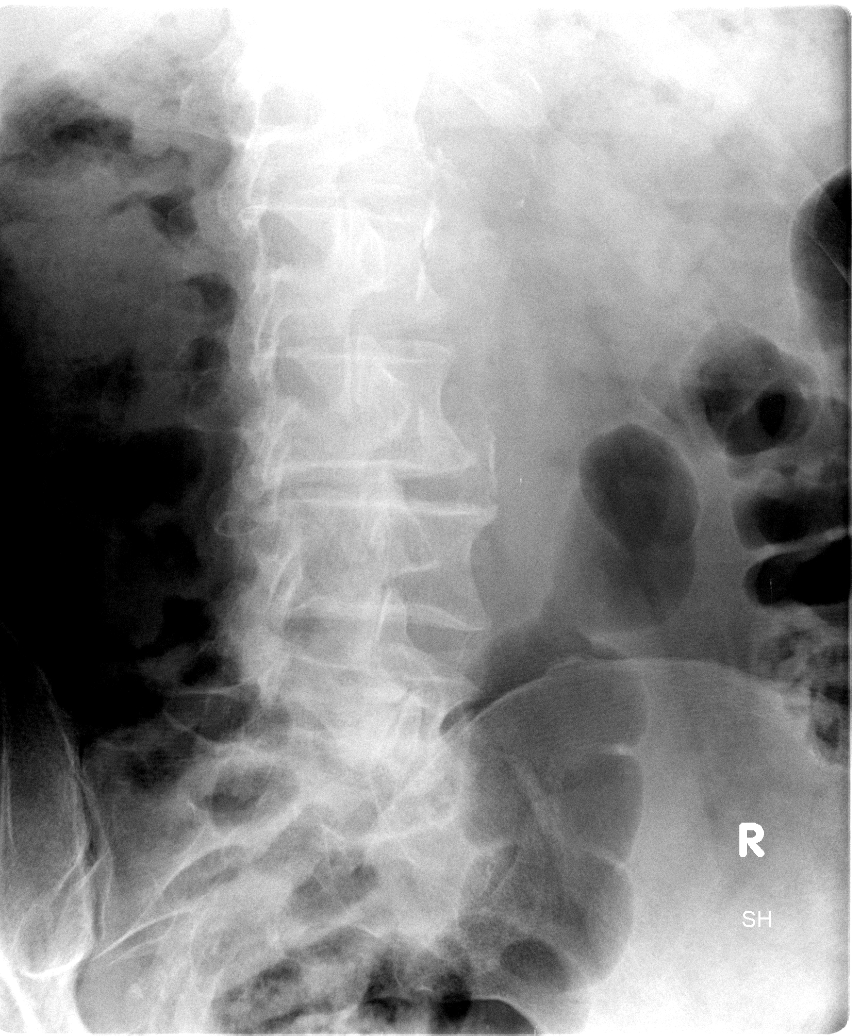

[view not recorded (4 of 5)]
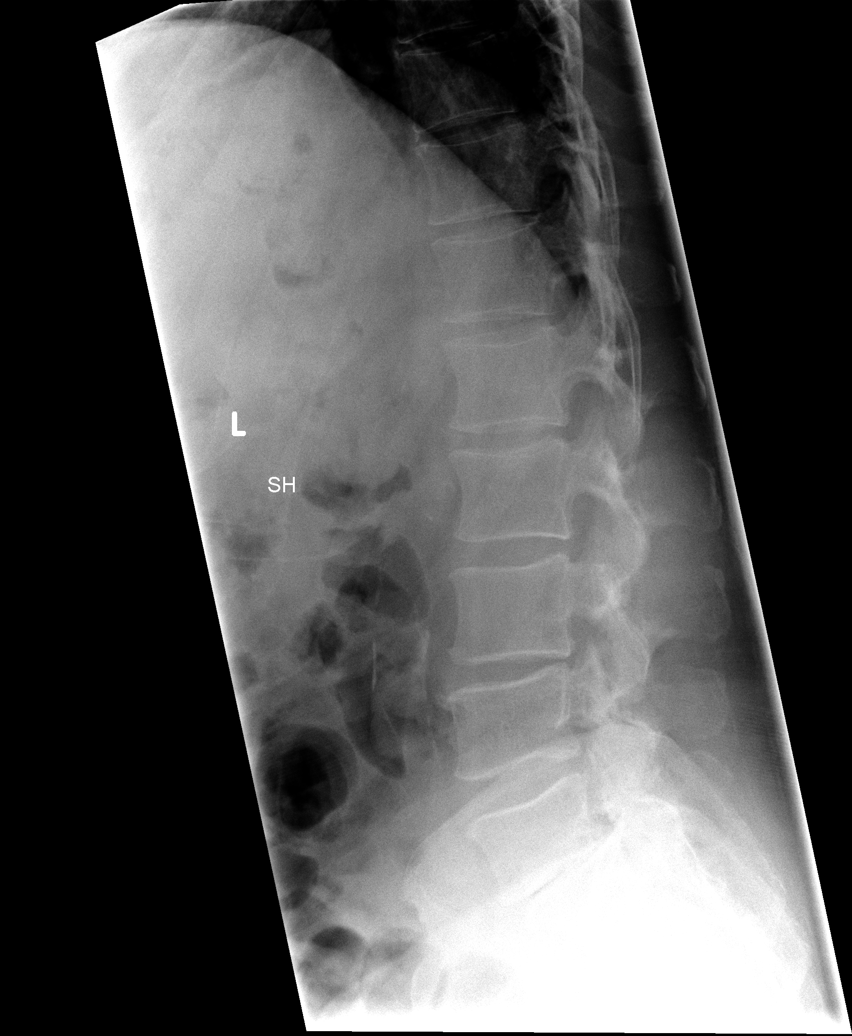

[view not recorded (5 of 5)]
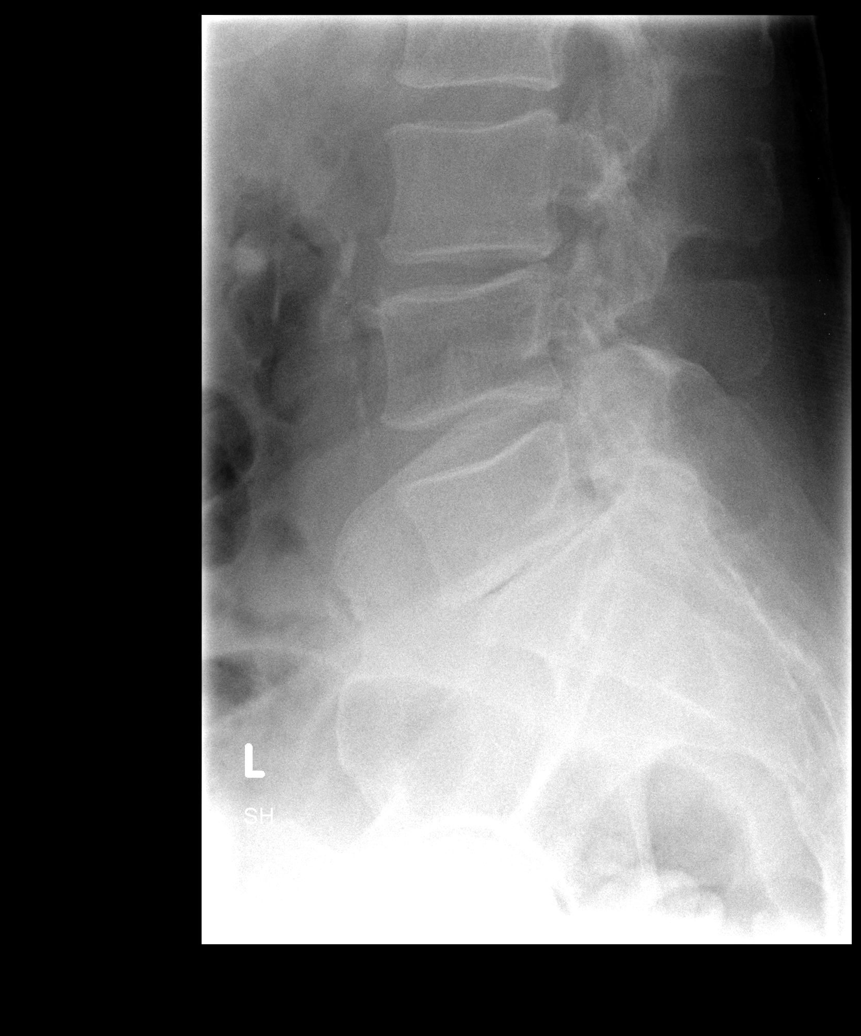

[5 of 5 positions shown; findings below may reference images not displayed]

FINDINGS: Severe disc space narrowing L5-S1.  Mild disc space
narrowing L3-L4.  Minimal 1-2 mm retrolisthesis L3 on L4.  No
definite anterolisthesis L5-S1.  Lower lumbar facet arthropathy. No
compression fracture or osseous destructive lesion.  Vascular
calcification.
IMPRESSION: Lumbar spondylosis as described.

## 2014-01-15 ENCOUNTER — Other Ambulatory Visit: Payer: Medicare PPO

## 2014-01-15 ENCOUNTER — Other Ambulatory Visit (HOSPITAL_COMMUNITY): Payer: Medicare PPO

## 2014-01-15 ENCOUNTER — Telehealth: Payer: Self-pay | Admitting: *Deleted

## 2014-01-15 NOTE — Telephone Encounter (Signed)
Left message for patient to call us back about CT scan results Call - NO cancer on CT scan!!

## 2014-01-19 ENCOUNTER — Other Ambulatory Visit: Payer: Self-pay | Admitting: Family Medicine

## 2014-01-22 ENCOUNTER — Ambulatory Visit (HOSPITAL_BASED_OUTPATIENT_CLINIC_OR_DEPARTMENT_OTHER): Payer: Medicare Other | Admitting: Hematology & Oncology

## 2014-01-22 ENCOUNTER — Encounter: Payer: Self-pay | Admitting: Hematology & Oncology

## 2014-01-22 VITALS — BP 136/69 | HR 81 | Temp 97.7°F | Resp 18 | Ht 70.0 in | Wt 197.0 lb

## 2014-01-22 DIAGNOSIS — C649 Malignant neoplasm of unspecified kidney, except renal pelvis: Secondary | ICD-10-CM

## 2014-01-22 DIAGNOSIS — C61 Malignant neoplasm of prostate: Secondary | ICD-10-CM

## 2014-01-22 NOTE — Progress Notes (Signed)
Hematology and Oncology Follow Up Visit  Anthony Stark [id number] 1935/03/20 79 y.o. 01/22/2014   Principle Diagnosis:  . 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 2. Early-stage prostate cancer.  Current Therapy:    Observation.     Interim History:  Mr.  Stark is back for followup. He's been doing well. [place] repeated his CT scan. This was done on March 26. The CT scan did not show any evidence of recurrent or metastatic disease for skin cancer.  Exam no problems with abdominal pain. He's had no problems with cough or shortness of breath. He's had no fever sweats or chills. His had no leg swelling. He's had no back issues. He had a back surgery back in 2013. He is still taking it easy. He's walking quite a bit.  Unfortunately, he and his wife will probably be moving down to [place] in the fall. Her daughter lives down there. This was to be close to her and her granddaughter. We will by him I oncologist once he does wear his want to live.  He's had no cough. She's had a headache. There's been no problems with his appetite.  Medications: Current outpatient prescriptions:Diphenhydramine-APAP, sleep, (TYLENOL PM EXTRA STRENGTH PO), Take 2 tablets by mouth at bedtime. , Disp: , Rfl: ;  quinapril-hydrochlorothiazide (ACCURETIC) 10-12.5 MG per tablet, TAKE 1 TABLET DAILY, Disp: 90 tablet, Rfl: 0;  simvastatin (ZOCOR) 20 MG tablet, TAKE 1 TABLET DAILY WITH BREAKFAST, Disp: 90 tablet, Rfl: 1  Allergies: No Known Allergies  Past Medical History, Surgical history, Social history, and Family History were reviewed and updated.  Review of Systems: As above  Physical Exam:  height is 5\' 10"  (1.778 m) and weight is 197 lb (89.359 kg). His oral temperature is 97.7 F (36.5 C). His blood pressure is 136/69 and his pulse is 81. His respiration is 18.   Well-developed and well-nourished gentleman. Lungs are clear. Cardiac exam regular in rhythm. Abdomen is soft. Has well-healed  laparoscopy scars on the right side from the nephrectomy. No fluid wave. No palpable liver or spleen tip. Lymph node exam shows no palpable lymphadenopathy. Back exam no tenderness over the spine ribs or hips. Has laminectomy scar in the lumbar spine. Extremities shows no clubbing cyanosis or edema. Has good range of motion of his joints. Skin exam no rashes. Neurological exam no focal neurological deficits.  Lab Results  Component Value Date   WBC 7.0 01/08/2014   HGB 15.0 01/08/2014   HCT 45.2 01/08/2014   MCV 92.5 01/08/2014   PLT 202 01/08/2014     Chemistry      Component Value Date/Time   NA 141 01/08/2014 0931   NA 139 08/20/2013 0844   NA 139 01/15/2013 0947   K 4.2 01/08/2014 0931   K 4.4 08/20/2013 0844   K 4.4 01/15/2013 0947   CL 100 08/20/2013 0844   CL 100 01/15/2013 0947   CO2 27 01/08/2014 0931   CO2 28 08/20/2013 0844   CO2 31 01/15/2013 0947   BUN 13.5 01/08/2014 0931   BUN 15 08/20/2013 0844   BUN 13 01/15/2013 0947   CREATININE 1.2 01/08/2014 0931   CREATININE 1.07 08/20/2013 0844   CREATININE 1.12 04/14/2013 1037      Component Value Date/Time   CALCIUM 10.0 01/08/2014 0931   CALCIUM 9.6 08/20/2013 0844   CALCIUM 9.5 01/15/2013 0947   ALKPHOS 44 01/08/2014 0931   ALKPHOS 37* 08/20/2013 0844   ALKPHOS 31  01/15/2013 0947   AST 17 01/08/2014 0931   AST 21 08/20/2013 0844   AST 19 01/15/2013 0947   ALT 17 01/08/2014 0931   ALT 23 08/20/2013 0844   ALT 13 01/15/2013 0947   BILITOT 0.50 01/08/2014 0931   BILITOT 0.6 08/20/2013 0844   BILITOT 0.70 01/15/2013 0947         Impression and Plan: Anthony Stark is 79 year old gentleman. He has a history of stage I renal cell carcinoma of the right kidney. This is a clear cell histology. He had this resected back in May of 2013.  He will have her one last set of scans. Hopefully we can get please set up before he goes down to [place]. I think these scans look okay, then no further scans need to be done unless he has symptoms.  We will try to plan for  these scans to be done in September.  I will see him back afterwards and hopefully we will be able to let him go down to [place] free of any cancer.   Volanda Napoleon, MD 4/9/20159:47 AM

## 2014-02-24 ENCOUNTER — Other Ambulatory Visit: Payer: Self-pay | Admitting: Dermatology

## 2014-03-30 ENCOUNTER — Encounter: Payer: Self-pay | Admitting: Family Medicine

## 2014-03-30 ENCOUNTER — Ambulatory Visit (INDEPENDENT_AMBULATORY_CARE_PROVIDER_SITE_OTHER): Payer: Medicare Other | Admitting: Family Medicine

## 2014-03-30 VITALS — BP 129/77 | HR 73 | Wt 197.0 lb

## 2014-03-30 DIAGNOSIS — Z23 Encounter for immunization: Secondary | ICD-10-CM

## 2014-03-30 DIAGNOSIS — Z0000 Encounter for general adult medical examination without abnormal findings: Secondary | ICD-10-CM

## 2014-03-30 DIAGNOSIS — Z136 Encounter for screening for cardiovascular disorders: Secondary | ICD-10-CM

## 2014-03-30 NOTE — Progress Notes (Signed)
Subjective:    Anthony Stark is a 79 y.o. male who presents for Medicare Annual/Subsequent preventive examination.   Preventive Screening-Counseling & Management  Tobacco History  Smoking status  . Former Smoker -- 1.00 packs/day for 20 years  . Types: Cigarettes  . Start date: 08/24/1956  . Quit date: 01/23/1976  Smokeless tobacco  . Never Used    Comment: quit 40 years    Problems Prior to Visit 1.   Current Problems (verified) Patient Active Problem List   Diagnosis Date Noted  . Ventral hernia 03/11/2013  . Renal cell carcinoma 03/28/2012  . Spinal stenosis 02/21/2012  . 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 NECK PAIN 06/09/2010  . ADENOCARCINOMA, PROSTATE 01/01/2008  . ERECTILE DYSFUNCTION 12/31/2007  . NOCTURIA 12/31/2007  . TINNITUS NOS 04/30/2007  . HYPERCHOLESTEROLEMIA 01/09/2007  . KNEE PAIN, RIGHT 01/09/2007  . HYPERTENSION, BENIGN 12/18/2006  . FATIGUE 12/18/2006    Medications Prior to Visit Current Outpatient Prescriptions on File Prior to Visit  Medication Sig Dispense Refill  . Diphenhydramine-APAP, sleep, (TYLENOL PM EXTRA STRENGTH PO) Take 2 tablets by mouth at bedtime.       . quinapril-hydrochlorothiazide (ACCURETIC) 10-12.5 MG per tablet TAKE 1 TABLET DAILY  90 tablet  0  . simvastatin (ZOCOR) 20 MG tablet TAKE 1 TABLET DAILY WITH BREAKFAST  90 tablet  1   No current facility-administered medications on file prior to visit.    Current Medications (verified) Current Outpatient Prescriptions  Medication Sig Dispense Refill  . Diphenhydramine-APAP, sleep, (TYLENOL PM EXTRA STRENGTH PO) Take 2 tablets by mouth at bedtime.       . quinapril-hydrochlorothiazide (ACCURETIC) 10-12.5 MG per tablet TAKE 1 TABLET DAILY  90 tablet  0  . simvastatin (ZOCOR) 20 MG tablet TAKE 1 TABLET DAILY WITH BREAKFAST  90 tablet  1   No current facility-administered medications for this visit.     Allergies (verified) Review of patient's  allergies indicates no known allergies.   PAST HISTORY  Family History No family history on file.  Social History History  Substance Use Topics  . Smoking status: Former Smoker -- 1.00 packs/day for 20 years    Types: Cigarettes    Start date: 08/24/1956    Quit date: 01/23/1976  . Smokeless tobacco: Never Used     Comment: quit 40 years  . Alcohol Use: 10.5 oz/week    21 drink(s) per week     Comment: 04/23/2013 "3 drinks w/1 shot in each/day"    Are there smokers in your home (other than you)?  No  Risk Factors Current exercise habits: walks 1-2 miles per day  Dietary issues discussed: none   Cardiac risk factors: advanced age (older than 61 for men, 17 for women) and male gender.  Depression Screen (Note: if answer to either of the following is "Yes", a more complete depression screening is indicated)   Q1: Over the past two weeks, have you felt down, depressed or hopeless? No  Q2: Over the past two weeks, have you felt little interest or pleasure in doing things? No  Have you lost interest or pleasure in daily life? No  Do you often feel hopeless? No  Do you cry easily over simple problems? No  Activities of Daily Living In your present state of health, do you have any difficulty performing the following activities?:  Driving? No Managing money?  No Feeding yourself? No Getting from bed to chair? No Climbing a flight of stairs? No  Preparing food and eating?: No Bathing or showering? No Getting dressed: No Getting to the toilet? No Using the toilet:No Moving around from place to place: No In the past year have you fallen or had a near fall?:Yes   Are you sexually active?  No  Do you have more than one partner?  No  Hearing Difficulties: No Do you often ask people to speak up or repeat themselves? No Do you experience ringing or noises in your ears? Yes Do you have difficulty understanding soft or whispered voices? No   Do you feel that you have a problem  with memory? No  Do you often misplace items? No  Do you feel safe at home?  Yes  Cognitive Testing  Alert? Yes  Normal Appearance?Yes  Oriented to person? Yes  Place? Yes   Time? Yes  Recall of three objects?  Yes  Can perform simple calculations? Yes  Displays appropriate judgment?Yes  Can read the correct time from a watch face?Yes   Advanced Directives have been discussed with the patient? Yes   List the Names of Other Physician/Practitioners you currently use: 1.  Dr. Kathie Rhodes 2. Dr. Burney Gauze  Indicate any recent Medical Services you may have received from other than [facility] providers in the past year (date may be approximate).  Immunization History  Administered Date(s) Administered  . Influenza Whole 08/18/2008  . Influenza,inj,Quad PF,36+ Mos 07/24/2013  . Pneumococcal Polysaccharide-23 06/09/2010  . Tdap 01/08/2012  . Zoster 07/22/2010    Screening Tests Health Maintenance  Topic Date Due  . Influenza Vaccine  05/16/2014  . Colonoscopy  11/19/2016  . Tetanus/tdap  01/07/2022  . Pneumococcal Polysaccharide Vaccine Age 20 And Over  Completed  . Zostavax  Completed    All answers were reviewed with the patient and necessary referrals were made:  METHENEY,CATHERINE, MD   03/30/2014   History reviewed: allergies, current medications, past family history, past medical history, past social history, past surgical history and problem list  Review of Systems A comprehensive review of systems was negative.    Objective:     Vision by Snellen chart: eye exam done.  Blood pressure 129/77, pulse 73, weight 197 lb (89.359 kg). Body mass index is 28.27 kg/(m^2).  BP 129/77  Pulse 73  Wt 197 lb (89.359 kg)  General Appearance:    Alert, cooperative, no distress, appears stated age  Head:    Normocephalic, without obvious abnormality, atraumatic  Eyes:    PERRL, conjunctiva/corneas clear, EOM's intact, fundi    benign, both eyes       Ears:    Normal TM's  and external ear canals, both ears  Nose:   Nares normal, septum midline, mucosa normal, no drainage    or sinus tenderness  Throat:   Lips, mucosa, and tongue normal; teeth and gums normal  Neck:   Supple, symmetrical, trachea midline, no adenopathy;       thyroid:  No enlargement/tenderness/nodules; no carotid   bruit or JVD  Back:     Symmetric, no curvature, ROM normal, no CVA tenderness  Lungs:     Clear to auscultation bilaterally, respirations unlabored  Chest wall:    No tenderness or deformity  Heart:    Regular rate and rhythm, S1 and S2 normal, no murmur, rub   or gallop  Abdomen:     Soft, non-tender, bowel sounds active all four quadrants,    no masses, no organomegaly  Genitalia:    Normal male without lesion,  discharge or tenderness  Rectal:    Normal tone, normal prostate, no masses or tenderness;   guaiac negative stool  Extremities:   Extremities normal, atraumatic, no cyanosis or edema  Pulses:   2+ and symmetric all extremities  Skin:   Skin color, texture, turgor normal, no rashes or lesions  Lymph nodes:   Cervical, supraclavicular, and axillary nodes normal  Neurologic:   CNII-XII intact. Normal strength, sensation and reflexes      throughout       Assessment:     [place] wellness Exam      Plan:     During the course of the visit the patient was educated and counseled about appropriate screening and preventive services including:    Pneumococcal vaccine   EKG - rate of 69 beats per minute, normal sinus rhythm. No acute ST-T wave changes.  Diet review for nutrition referral? Yes ____  Not Indicated __x_   Patient Instructions (the written plan) was given to the patient.  Medicare Attestation I have personally reviewed: The patient's medical and social history Their use of alcohol, tobacco or illicit drugs Their current medications and supplements The patient's functional ability including ADLs,fall risks, home safety risks, cognitive, and  hearing and visual impairment Diet and physical activities Evidence for depression or mood disorders  The patient's weight, height, BMI, and visual acuity have been recorded in the chart.  I have made referrals, counseling, and provided education to the patient based on review of the above and I have provided the patient with a written personalized care plan for preventive services.     METHENEY,CATHERINE, MD   03/30/2014

## 2014-04-19 ENCOUNTER — Other Ambulatory Visit: Payer: Self-pay | Admitting: Family Medicine

## 2014-05-23 ENCOUNTER — Other Ambulatory Visit: Payer: Self-pay | Admitting: Family Medicine

## 2014-07-01 ENCOUNTER — Encounter (HOSPITAL_BASED_OUTPATIENT_CLINIC_OR_DEPARTMENT_OTHER): Payer: Self-pay

## 2014-07-01 ENCOUNTER — Ambulatory Visit (HOSPITAL_BASED_OUTPATIENT_CLINIC_OR_DEPARTMENT_OTHER)
Admission: RE | Admit: 2014-07-01 | Discharge: 2014-07-01 | Disposition: A | Discharge status: Home / Self Care | Payer: Medicare Other | Source: Ambulatory Visit | Attending: Hematology & Oncology | Admitting: Hematology & Oncology

## 2014-07-01 ENCOUNTER — Encounter: Payer: Self-pay | Admitting: Hematology & Oncology

## 2014-07-01 ENCOUNTER — Ambulatory Visit (HOSPITAL_BASED_OUTPATIENT_CLINIC_OR_DEPARTMENT_OTHER): Payer: Medicare Other | Admitting: Hematology & Oncology

## 2014-07-01 ENCOUNTER — Other Ambulatory Visit (HOSPITAL_BASED_OUTPATIENT_CLINIC_OR_DEPARTMENT_OTHER): Payer: Medicare Other | Admitting: Lab

## 2014-07-01 VITALS — BP 128/68 | HR 72 | Temp 97.8°F | Resp 18 | Ht 70.0 in | Wt 198.0 lb

## 2014-07-01 DIAGNOSIS — C641 Malignant neoplasm of right kidney, except renal pelvis: Secondary | ICD-10-CM

## 2014-07-01 DIAGNOSIS — Z8546 Personal history of malignant neoplasm of prostate: Secondary | ICD-10-CM | POA: Diagnosis not present

## 2014-07-01 DIAGNOSIS — Q619 Cystic kidney disease, unspecified: Secondary | ICD-10-CM | POA: Insufficient documentation

## 2014-07-01 DIAGNOSIS — C649 Malignant neoplasm of unspecified kidney, except renal pelvis: Secondary | ICD-10-CM

## 2014-07-01 DIAGNOSIS — C61 Malignant neoplasm of prostate: Secondary | ICD-10-CM

## 2014-07-01 DIAGNOSIS — R911 Solitary pulmonary nodule: Secondary | ICD-10-CM | POA: Diagnosis not present

## 2014-07-01 DIAGNOSIS — Z905 Acquired absence of kidney: Secondary | ICD-10-CM | POA: Diagnosis not present

## 2014-07-01 DIAGNOSIS — Z85528 Personal history of other malignant neoplasm of kidney: Secondary | ICD-10-CM

## 2014-07-01 LAB — CMP (CANCER CENTER ONLY)
ALT(SGPT): 18 U/L (ref 10–47)
AST: 22 U/L (ref 11–38)
Albumin: 4.3 g/dL (ref 3.3–5.5)
Alkaline Phosphatase: 29 U/L (ref 26–84)
BILIRUBIN TOTAL: 0.9 mg/dL (ref 0.20–1.60)
BUN, Bld: 13 mg/dL (ref 7–22)
CALCIUM: 9.8 mg/dL (ref 8.0–10.3)
CHLORIDE: 102 meq/L (ref 98–108)
CO2: 29 mEq/L (ref 18–33)
Creat: 1.2 mg/dl (ref 0.6–1.2)
GLUCOSE: 104 mg/dL (ref 73–118)
Potassium: 4.3 mEq/L (ref 3.3–4.7)
Sodium: 143 mEq/L (ref 128–145)
TOTAL PROTEIN: 7.6 g/dL (ref 6.4–8.1)

## 2014-07-01 LAB — CBC WITH DIFFERENTIAL (CANCER CENTER ONLY)
BASO#: 0 10*3/uL (ref 0.0–0.2)
BASO%: 0.5 % (ref 0.0–2.0)
EOS ABS: 0.1 10*3/uL (ref 0.0–0.5)
EOS%: 1.9 % (ref 0.0–7.0)
HCT: 44.8 % (ref 38.7–49.9)
HEMOGLOBIN: 15.2 g/dL (ref 13.0–17.1)
LYMPH#: 1.5 10*3/uL (ref 0.9–3.3)
LYMPH%: 24.8 % (ref 14.0–48.0)
MCH: 31.5 pg (ref 28.0–33.4)
MCHC: 33.9 g/dL (ref 32.0–35.9)
MCV: 93 fL (ref 82–98)
MONO#: 0.7 10*3/uL (ref 0.1–0.9)
MONO%: 12.5 % (ref 0.0–13.0)
NEUT#: 3.5 10*3/uL (ref 1.5–6.5)
NEUT%: 60.3 % (ref 40.0–80.0)
Platelets: 192 10*3/uL (ref 145–400)
RBC: 4.82 10*6/uL (ref 4.20–5.70)
RDW: 13.3 % (ref 11.1–15.7)
WBC: 5.9 10*3/uL (ref 4.0–10.0)

## 2014-07-01 LAB — LACTATE DEHYDROGENASE: LDH: 117 U/L (ref 94–250)

## 2014-07-01 LAB — TECHNOLOGIST REVIEW CHCC SATELLITE

## 2014-07-01 MED ORDER — IOHEXOL 300 MG/ML  SOLN
100.0000 mL | Freq: Once | INTRAMUSCULAR | Status: AC | PRN
  Administered 2014-07-01: 100 mL via INTRAVENOUS

## 2014-07-01 NOTE — Progress Notes (Signed)
Hematology and Oncology Follow Up Visit  DEEGAN VALENTINO [id number] Jul 24, 1935 79 y.o. 07/01/2014   Principle Diagnosis:   1. 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  2. Early-stage prostate cancer.   Current Therapy:    Observation     Interim History:  Mr.  Majchrzak is back for followup. We went ahead and got scans on him today. He get a CT scan of the chest, abdomen and pelvis. There is no evidence of recurrent kidney cancer.  He and his wife are getting set to move to [place], [place]. Their daughter and granddaughter live down there. It would be closer to them. They I probably will be moving within 3 months or so. He will find a family doctor down there. I told him to have their office call us and we will be more than happy to fax down records.  He's had no problems with bowels or bladder. He's had no abdominal pain. There's been no cough. He's had no leg swelling.  He had back surgery for the lumbar spine probably about a year or so ago. This has really helped him out.  Medications: Current outpatient prescriptions:Diphenhydramine-APAP, sleep, (TYLENOL PM EXTRA STRENGTH PO), Take 2 tablets by mouth at bedtime. , Disp: , Rfl: ;  quinapril-hydrochlorothiazide (ACCURETIC) 10-12.5 MG per tablet, TAKE 1 TABLET DAILY, Disp: 90 tablet, Rfl: 2;  simvastatin (ZOCOR) 20 MG tablet, TAKE 1 TABLET DAILY WITH BREAKFAST, Disp: 90 tablet, Rfl: 0  Allergies: No Known Allergies  Past Medical History, Surgical history, Social history, and Family History were reviewed and updated.  Review of Systems: As above  Physical Exam:  height is 5\' 10"  (1.778 m) and weight is 198 lb (89.812 kg). His oral temperature is 97.8 F (36.6 C). His blood pressure is 128/68 and his pulse is 72. His respiration is 18.   Well-developed well-nourished white gentleman in no obvious distress. Head and neck exam shows no ocular or oral lesions. There are no palpable cervical or supraclavicular lymph  nodes. Lungs are clear. Cardiac exam regular even with no murmurs rubs or bruits. Abdomen is soft. Has good bowel sounds. Is no palpable abdominal mass. There is no palpable liver or spleen tip. Has well-healed laparoscopy scars. Back exam no tenderness over the spine ribs or hips. Extremities shows no clubbing, cyanosis or edema. Skin exam no rashes, ecchymoses or petechia. Neurological exam shows no focal neurological deficits.  Lab Results  Component Value Date   WBC 5.9 07/01/2014   HGB 15.2 07/01/2014   HCT 44.8 07/01/2014   MCV 93 07/01/2014   PLT 192 07/01/2014     Chemistry      Component Value Date/Time   NA 143 07/01/2014 0934   NA 141 01/08/2014 0931   NA 139 08/20/2013 0844   K 4.3 07/01/2014 0934   K 4.2 01/08/2014 0931   K 4.4 08/20/2013 0844   CL 102 07/01/2014 0934   CL 100 08/20/2013 0844   CO2 29 07/01/2014 0934   CO2 27 01/08/2014 0931   CO2 28 08/20/2013 0844   BUN 13 07/01/2014 0934   BUN 13.5 01/08/2014 0931   BUN 15 08/20/2013 0844   CREATININE 1.2 07/01/2014 0934   CREATININE 1.2 01/08/2014 0931   CREATININE 1.12 04/14/2013 1037      Component Value Date/Time   CALCIUM 9.8 07/01/2014 0934   CALCIUM 10.0 01/08/2014 0931   CALCIUM 9.6 08/20/2013 0844   ALKPHOS 29 07/01/2014 0934   ALKPHOS  44 01/08/2014 0931   ALKPHOS 37* 08/20/2013 0844   AST 22 07/01/2014 0934   AST 17 01/08/2014 0931   AST 21 08/20/2013 0844   ALT 18 07/01/2014 0934   ALT 17 01/08/2014 0931   ALT 23 08/20/2013 0844   BILITOT 0.90 07/01/2014 0934   BILITOT 0.50 01/08/2014 0931   BILITOT 0.6 08/20/2013 0844         Impression and Plan: Mr. Kampe is 79 year old gentleman with a history of a right renal cell carcinoma. This was stage I. He underwent laparoscopic nephrectomy. He did well with this. This was done back in May of 2013.  For now, we will go ahead and plan to get his records faxed down to [place] once he establishes himself with a family doctor.  I am very confident that he is cured. I really  don't see any problem with respect to his kidney cancer coming back. I the chance of his kidney cancer, back probably is going to be less than 10-15%.  It has been a pleasure to help take care of him. He is very nice. He went to [facility], but I did so we have a lot to talk about.  If he needs [place] while he is still up in the area, I will be more than happy to see him back.   Volanda Napoleon, MD 9/16/20151:42 PM

## 2014-07-19 ENCOUNTER — Other Ambulatory Visit: Payer: Self-pay | Admitting: Family Medicine

## 2014-10-15 ENCOUNTER — Encounter: Payer: Self-pay | Admitting: Nurse Practitioner

## 2014-10-15 NOTE — Progress Notes (Signed)
As requested all medical records have been faxed to [facility] [phone].

## 2014-10-17 ENCOUNTER — Other Ambulatory Visit: Payer: Self-pay | Admitting: Family Medicine

## 2014-11-09 ENCOUNTER — Telehealth: Payer: Self-pay | Admitting: Hematology & Oncology

## 2014-11-09 NOTE — Telephone Encounter (Signed)
Faxed medical records STAT today to:  [facility] F: [phone] P: [phone]     COPY SCANNED

## 2014-12-12 ENCOUNTER — Other Ambulatory Visit: Payer: Self-pay | Admitting: Family Medicine

## 2015-01-15 ENCOUNTER — Other Ambulatory Visit: Payer: Self-pay | Admitting: Family Medicine

## 2015-01-15 NOTE — Telephone Encounter (Signed)
Attempted to contact Pt to schedule f/u appt. Phone number has been disconnected, no other number on file.
